# Patient Record
Sex: Female | Born: 1955 | Race: White | Hispanic: No | Marital: Married | Smoking: Never smoker
Health system: Southern US, Community
[De-identification: ages and names within clinical notes are randomized; demographics above are authoritative.]

## PROBLEM LIST (undated history)

## (undated) DIAGNOSIS — E039 Hypothyroidism, unspecified: Secondary | ICD-10-CM

## (undated) DIAGNOSIS — K635 Polyp of colon: Secondary | ICD-10-CM

## (undated) DIAGNOSIS — R011 Cardiac murmur, unspecified: Secondary | ICD-10-CM

## (undated) DIAGNOSIS — F489 Nonpsychotic mental disorder, unspecified: Secondary | ICD-10-CM

## (undated) DIAGNOSIS — F5105 Insomnia due to other mental disorder: Secondary | ICD-10-CM

## (undated) DIAGNOSIS — C801 Malignant (primary) neoplasm, unspecified: Secondary | ICD-10-CM

## (undated) DIAGNOSIS — E010 Iodine-deficiency related diffuse (endemic) goiter: Secondary | ICD-10-CM

## (undated) DIAGNOSIS — F329 Major depressive disorder, single episode, unspecified: Secondary | ICD-10-CM

## (undated) DIAGNOSIS — F419 Anxiety disorder, unspecified: Secondary | ICD-10-CM

## (undated) DIAGNOSIS — F32A Depression, unspecified: Secondary | ICD-10-CM

## (undated) DIAGNOSIS — I10 Essential (primary) hypertension: Secondary | ICD-10-CM

## (undated) DIAGNOSIS — E079 Disorder of thyroid, unspecified: Secondary | ICD-10-CM

## (undated) HISTORY — DX: Malignant (primary) neoplasm, unspecified: C80.1

## (undated) HISTORY — PX: MOHS SURGERY: SUR867

## (undated) HISTORY — DX: Iodine-deficiency related diffuse (endemic) goiter: E01.0

## (undated) HISTORY — DX: Disorder of thyroid, unspecified: E07.9

## (undated) HISTORY — DX: Cardiac murmur, unspecified: R01.1

## (undated) HISTORY — DX: Insomnia due to other mental disorder: F48.9

## (undated) HISTORY — DX: Nonpsychotic mental disorder, unspecified: F51.05

## (undated) HISTORY — DX: Polyp of colon: K63.5

## (undated) HISTORY — DX: Anxiety disorder, unspecified: F41.9

## (undated) HISTORY — DX: Essential (primary) hypertension: I10

## (undated) HISTORY — DX: Depression, unspecified: F32.A

## (undated) HISTORY — DX: Major depressive disorder, single episode, unspecified: F32.9

## (undated) HISTORY — DX: Hypothyroidism, unspecified: E03.9

## (undated) HISTORY — PX: TONSILLECTOMY: SUR1361

---

## 1985-06-11 DIAGNOSIS — Z85828 Personal history of other malignant neoplasm of skin: Secondary | ICD-10-CM

## 1985-06-11 HISTORY — DX: Personal history of other malignant neoplasm of skin: Z85.828

## 2005-12-26 ENCOUNTER — Ambulatory Visit: Payer: Self-pay

## 2006-06-11 HISTORY — PX: APPENDECTOMY: SHX54

## 2006-10-18 ENCOUNTER — Observation Stay: Payer: Self-pay | Admitting: General Surgery

## 2007-02-04 ENCOUNTER — Ambulatory Visit: Payer: Self-pay | Admitting: Internal Medicine

## 2007-05-01 ENCOUNTER — Ambulatory Visit: Payer: Self-pay | Admitting: Unknown Physician Specialty

## 2008-04-15 ENCOUNTER — Ambulatory Visit: Payer: Self-pay | Admitting: Internal Medicine

## 2010-05-18 ENCOUNTER — Ambulatory Visit: Payer: Self-pay

## 2010-12-01 ENCOUNTER — Ambulatory Visit (HOSPITAL_BASED_OUTPATIENT_CLINIC_OR_DEPARTMENT_OTHER)
Admission: RE | Admit: 2010-12-01 | Discharge: 2010-12-01 | Disposition: A | Discharge status: Home / Self Care | Payer: PRIVATE HEALTH INSURANCE | Source: Ambulatory Visit | Attending: Gynecology | Admitting: Gynecology

## 2010-12-01 ENCOUNTER — Other Ambulatory Visit: Payer: Self-pay | Admitting: Gynecology

## 2010-12-01 DIAGNOSIS — Q5128 Other doubling of uterus, other specified: Secondary | ICD-10-CM | POA: Insufficient documentation

## 2010-12-01 DIAGNOSIS — N840 Polyp of corpus uteri: Secondary | ICD-10-CM | POA: Insufficient documentation

## 2010-12-01 DIAGNOSIS — I10 Essential (primary) hypertension: Secondary | ICD-10-CM | POA: Insufficient documentation

## 2011-01-18 NOTE — Op Note (Signed)
  NAMEABBI, Teresa Petty NO.:  [id number]  MEDICAL RECORD NO.:  [id number]  LOCATION:                                 FACILITY:  PHYSICIAN:  Gretta Cool, M.D. DATE OF BIRTH:  07-Feb-1956  DATE OF PROCEDURE:  12/01/2010 DATE OF DISCHARGE:                              OPERATIVE REPORT   PREOPERATIVE DIAGNOSES: 1. 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 2. Previous resection of the left uterine horn and endometrial polyps     in 1996 with no recurrence there.  POSTOPERATIVE DIAGNOSES: 1. 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 2. Previous resection of the left uterine horn and endometrial polyps     in 1996 with no recurrence there.  PROCEDURE:  Hysteroscopy with resection of endometrial polyps from the right uterine horn.  SURGEON:  Gretta Cool, M.D.  ANESTHESIA:  IV sedation and paracervical block.  DESCRIPTION OF PROCEDURE:  Under excellent anesthesia as above with the patient prepped and draped in lithotomy position in [place] stirrups, a 7- mm resectoscope was introduced after adequate dilation of the cervix with Pratt dilators to 33.  The cavity was then examined.  It was exceedingly difficult to keep the cavity distended and see around the cavity because of its very small size.  The polyps were identified and resected as possible.  The visibility was difficult enough that it was considered a risk to continue for complete resection of the polyps, but adequate sampling was felt to have been obtained to rule out endometrial hyperplasia or more.  At this point, the procedure was terminated without complication with significant fluid deficit of approximately 400 cc.  Complications are none.          ______________________________ Gretta Cool, M.D.     CWL/MEDQ  D:  12/01/2010  T:  12/01/2010  Job:  [id number]  cc:   Dr. Welton Flakes  Electronically Signed by  Beather Arbour M.D. on 01/18/2011 09:11:12 AM

## 2011-07-04 ENCOUNTER — Ambulatory Visit: Payer: Self-pay | Admitting: Internal Medicine

## 2012-08-09 HISTORY — PX: COLONOSCOPY: SHX174

## 2012-08-12 ENCOUNTER — Ambulatory Visit: Payer: Self-pay | Admitting: Unknown Physician Specialty

## 2012-10-03 ENCOUNTER — Ambulatory Visit: Payer: PRIVATE HEALTH INSURANCE | Admitting: Cardiovascular Disease

## 2012-10-30 ENCOUNTER — Encounter: Payer: Self-pay | Admitting: Cardiovascular Disease

## 2012-10-30 ENCOUNTER — Ambulatory Visit (INDEPENDENT_AMBULATORY_CARE_PROVIDER_SITE_OTHER): Payer: PRIVATE HEALTH INSURANCE | Admitting: Cardiovascular Disease

## 2012-10-30 VITALS — BP 120/78 | HR 59 | Ht 66.0 in | Wt 158.8 lb

## 2012-10-30 DIAGNOSIS — I10 Essential (primary) hypertension: Secondary | ICD-10-CM

## 2012-10-30 DIAGNOSIS — M79609 Pain in unspecified limb: Secondary | ICD-10-CM

## 2012-10-30 DIAGNOSIS — M79602 Pain in left arm: Secondary | ICD-10-CM

## 2012-10-30 NOTE — Assessment & Plan Note (Signed)
Talked about her risk factors. She is a nonsmoker, no diabetes, no significant family history . EKG is normal, no known coronary artery disease. She is otherwise active, unable to reproduce any symptoms. We discussed that we could do a routine treadmill study. Other option would be to closely observe her and do additional studies for any recurrent symptoms. She prefers to wait, stay active, and call our office for any symptoms. If she does have any left arm pain, chest pain or shortness of breath, we would have her do a stress test. No changes to her medications at this time.

## 2012-10-30 NOTE — Patient Instructions (Addendum)
You are doing well. No medication changes were made.  Please call us if you have new issues that need to be addressed before your next appt.    

## 2012-10-30 NOTE — Addendum Note (Signed)
Addended by: Antonieta Iba on: 10/30/2012 12:41 PM   Modules accepted: Level of Service

## 2012-10-30 NOTE — Progress Notes (Signed)
Patient ID: Teresa Petty, female    DOB: 19-Sep-1955, 57 y.o.   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 Patient of Dr. Sullivan Lone. Notes from Dr. Sullivan Lone indicate some history of anxiety/depression  She reports that back in March of this year, 2014, she was at work in a very stressful meeting when she developed left shoulder pain radiating down her left arm to a small extent. She estimated the pain at more than 5/10. Lasted 10-15 minutes, some nausea. She had a second episode one week later with similar circumstances. She describes the pain as a deep pain in the muscle.  Since then she has been very active. She spends most of her time working in the garden. She push [place], and does very strenuous activities. She has not been able to reproduce any chest or arm pain since that time. She is a nonsmoker, no diabetes. She reports cholesterol is well controlled.  EKG shows sinus rhythm with rate 59 beats per minute, no significant ST or T wave changes       Outpatient Encounter Prescriptions as of 10/30/2012  Medication Sig Dispense Refill  . amLODipine-benazepril (LOTREL) 10-40 MG per capsule Take 1 capsule by mouth daily.      Marland Kitchen buPROPion (WELLBUTRIN XL) 300 MG 24 hr tablet Take 300 mg by mouth daily.      . Cholecalciferol (VITAMIN D) 2000 UNITS tablet Take 2,000 Units by mouth daily.      . folic acid (FOLVITE) 800 MCG tablet Take 400 mcg by mouth daily.      Marland Kitchen levothyroxine (SYNTHROID, LEVOTHROID) 125 MCG tablet Take 1 tablet daily except on Sunday take 1 & 1/2 tablet.      . nebivolol (BYSTOLIC) 10 MG tablet Take 10 mg by mouth daily.      . temazepam (RESTORIL) 30 MG capsule Take 30 mg by mouth at bedtime as needed for sleep.       No facility-administered  encounter medications on file as of 10/30/2012.     Review of Systems  Constitutional: Negative.   HENT: Negative.   Eyes: Negative.   Respiratory: Negative.   Cardiovascular: Negative.   Gastrointestinal: Negative.   Musculoskeletal: Negative.        Left shoulder pain/arm pain episodes in March 2014  Skin: Negative.   Neurological: Negative.   Psychiatric/Behavioral: Negative.   All other systems reviewed and are negative.    BP 120/78  Pulse 59  Ht 5\' 6"  (1.676 m)  Wt 158 lb 12 oz (72.009 kg)  BMI 25.64 kg/m2  Physical Exam  Nursing note and vitals reviewed. Constitutional: She is oriented to person, place, and time. She appears well-developed and well-nourished.  HENT:  Head: Normocephalic.  Nose: Nose normal.  Mouth/Throat: Oropharynx is clear and moist.  Eyes: Conjunctivae are normal. Pupils are equal, round, and reactive to light.  Neck: Normal range of motion. Neck supple. No JVD present.  Cardiovascular: Normal rate, regular rhythm, S1 normal, S2 normal, normal heart sounds and intact distal pulses.  Exam reveals no gallop and no friction rub.   No murmur heard. Pulmonary/Chest: Effort normal and breath sounds normal. No respiratory distress. She has no wheezes. She has no rales. She exhibits no tenderness.  Abdominal: Soft. Bowel sounds are normal. She exhibits no distension. There  is no tenderness.  Musculoskeletal: Normal range of motion. She exhibits no edema and no tenderness.  Lymphadenopathy:    She has no cervical adenopathy.  Neurological: She is alert and oriented to person, place, and time. Coordination normal.  Skin: Skin is warm and dry. No rash noted. No erythema.  Psychiatric: She has a normal mood and affect. Her behavior is normal. Judgment and thought content normal.    Assessment and Plan

## 2012-10-30 NOTE — Assessment & Plan Note (Signed)
She does have significant stress which she attributes to work. Higher blood pressure at work, very low on the weekend. She's cutting back on her pills on the weekend to avoid hypotension and orthostasis. She will call us if she would like a lower dose of amlodipine/benazepril. Recent weight loss may be contributing to lower blood pressures.

## 2013-04-10 ENCOUNTER — Ambulatory Visit: Payer: Self-pay | Admitting: Podiatry

## 2013-04-16 ENCOUNTER — Other Ambulatory Visit: Payer: Self-pay

## 2013-05-06 ENCOUNTER — Ambulatory Visit: Payer: Self-pay | Admitting: Internal Medicine

## 2013-05-19 ENCOUNTER — Ambulatory Visit: Payer: Self-pay | Admitting: Gynecology

## 2013-05-29 ENCOUNTER — Ambulatory Visit: Payer: Self-pay | Admitting: Internal Medicine

## 2013-06-23 ENCOUNTER — Other Ambulatory Visit: Payer: Self-pay | Admitting: Unknown Physician Specialty

## 2013-06-23 LAB — CLOSTRIDIUM DIFFICILE(ARMC)

## 2013-06-26 ENCOUNTER — Encounter: Payer: Self-pay | Admitting: *Deleted

## 2013-07-16 ENCOUNTER — Ambulatory Visit (INDEPENDENT_AMBULATORY_CARE_PROVIDER_SITE_OTHER): Payer: No Typology Code available for payment source | Admitting: General Surgery

## 2013-07-16 ENCOUNTER — Encounter: Payer: Self-pay | Admitting: General Surgery

## 2013-07-16 VITALS — BP 148/74 | HR 76 | Resp 14 | Ht 66.5 in | Wt 153.0 lb

## 2013-07-16 DIAGNOSIS — G8929 Other chronic pain: Secondary | ICD-10-CM | POA: Insufficient documentation

## 2013-07-16 DIAGNOSIS — R1031 Right lower quadrant pain: Secondary | ICD-10-CM

## 2013-07-16 DIAGNOSIS — R109 Unspecified abdominal pain: Secondary | ICD-10-CM

## 2013-07-16 NOTE — Patient Instructions (Addendum)
The patient is aware to call back for any questions or concerns.  

## 2013-07-16 NOTE — Progress Notes (Signed)
Patient ID: SAUL DORSI, female   DOB: 04-02-1956, 58 y.o.   MRN: [id number]  Chief Complaint  Patient presents with  . Abdominal Pain    HPI Teresa Petty is a 58 y.o. female here today for evaluation of abdominal pain referred by Dawson Bills NP. Patient had an ultrasound and CT scan done 05/2013. States since November she is having lower right abdominal pain that comes and goes. States it seems to be dull hard ache that radiated through to right side.  The waves of nausea were at first but none since. Guaiac negative stool November 2014 [facility]. Constipation since November, bowels move every 3-4 days, but only with stimulants. She has tried using stool softners and senna. No real success with miralax. Weight loss of 20 pounds over 3 months ( last spring/April), states she had a burst of energy. Appetite has not changed.  The patient reports her weight has been stable since July/August 2014. Sitting and pulling knees upward tends to cause pain in the right lower quadrant of the abdomen.  HPI  Past Medical History  Diagnosis Date  . Anxiety   . Depressive disorder   . Thyroid disease   . Hypertension   . 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 Hypothyroidism   . Thyromegaly   . Heart murmur   . Cancer     basel cell  . Colon polyp     Past Surgical History  Procedure Laterality Date  . Tonsillectomy    . Colonoscopy  March 2014    Dr Vira Agar  . Appendectomy  2008    Dr Bary Castilla    Family History  Problem Relation Age of Onset  . Hypertension Mother   . Hyperlipidemia Mother   . Hypertension Father   . Heart disease Father   . Cancer Sister 77    appendix  . Melanoma Sister 21    Social History History  Substance Use Topics  . Smoking status: Never Smoker   . Smokeless tobacco: Never Used  . Alcohol Use: No    Allergies  Allergen Reactions  . Effexor [Venlafaxine Hcl]     Scalp felt numb and crawling    Current Outpatient Prescriptions   Medication Sig Dispense Refill  . amLODipine-benazepril (LOTREL) 10-40 MG per capsule Take 1 capsule by mouth daily.      Marland Kitchen buPROPion (WELLBUTRIN XL) 300 MG 24 hr tablet Take 300 mg by mouth daily.      . Cholecalciferol (VITAMIN D) 2000 UNITS tablet Take 2,000 Units by mouth daily.      . folic acid (FOLVITE) 382 MCG tablet Take 400 mcg by mouth daily.      Marland Kitchen levothyroxine (SYNTHROID, LEVOTHROID) 125 MCG tablet Take 125 mcg by mouth daily before breakfast.       . nebivolol (BYSTOLIC) 10 MG tablet Take 10 mg by mouth daily.       No current facility-administered medications for this visit.    Review of Systems Review of Systems  Constitutional: Negative.   Respiratory: Negative.   Cardiovascular: Negative.   Gastrointestinal: Positive for nausea, abdominal pain and constipation. Negative for vomiting, diarrhea, blood in stool, abdominal distention, anal bleeding and rectal pain.    Blood pressure 148/74, pulse 76, resp. rate 14, height 5' 6.5" (1.689 m), weight 153 lb (69.4 kg).  Physical Exam Physical Exam  Constitutional: She is oriented to person, place, and time. She appears well-developed and well-nourished.  Eyes: No scleral icterus.  Neck:  Neck supple.  Cardiovascular: Normal rate, regular rhythm and normal heart sounds.   No lower leg edema.  Pulmonary/Chest: Effort normal and breath sounds normal.  Abdominal: Soft. Normal appearance and bowel sounds are normal. There is tenderness in the right lower quadrant.  Lymphadenopathy:    She has no cervical adenopathy.  Neurological: She is alert and oriented to person, place, and time.  Skin: Skin is warm and dry.    Data Reviewed CT scan of the abdomen pelvis dated May 29, 2013 was reviewed. No evidence of acute inflammatory process noted. Bicornuate uterus.  Abdominal and ultrasound dated May 06, 2013 showed normal gallbladder. No pathologic process noted.  KUB dated June 22, 2013 was unremarkable.  GI  notes from June 22, 2013 were reviewed.  Colonoscopy report from August 12, 2012 showed a 3 mm polyp in the splenic flexure which was not retrieved. No other pathology except for a few small internal hemorrhoids.  Laboratory studies dated June 23, 2013 showed a normal CBC with a hemoglobin of 12.2, MCV 90. Normal differential. On testing blood sugar 114. Normal renal function. Normal liver function studies. Normal sedimentation rate.  Pathology from the appendectomy completed Oct 18, 2006 showed acute suppurative appendicitis.  The patient's sister,Deborah Johnson's records were available for review after the patient had left. Teresa Petty was explored with the suspicion for a ovarian cancer. 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 She subsequently underwent removal of both ovaries, omentum, sigmoid colon and rectum as well as the appendix. 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 No defect in the in a mismatch repair gene was reported on special testing. Operative notes were reviewed from both the attending surgeon and gynecologist.  The patient's sister,  Assessment    Unexplained right lower quadrant pain.     Plan    The patient's unexpected weight loss over a year ago has not progressed, and she reports her weight and appetite are stable. The only notable changes were chronic right lower quadrant pain which is not been able to relate to diet or activity. New onset constipation has not improved with multiple medical modalities.  She is had a well thought out and executed diagnostic plan with no etiology for her pain identified. With no progressive weight loss, it is unlikely that she is experiencing an intra-abdominal malignancy. In light of her sister's recent exploration with finding of extensive intraperitoneal tumor  metastases, I'm sure that this weighs heavily on her mind. Options for management at this time are limited to: 1) continued observation versus 2) diagnostic laparoscopy. Risk and benefits of Laparoscopy discussed in detail. The patient will consider her options notify the office of how she would like to proceed.       Robert Bellow 07/16/2013, 9:21 PM

## 2013-07-24 ENCOUNTER — Encounter: Payer: Self-pay | Admitting: General Surgery

## 2013-07-26 ENCOUNTER — Encounter: Payer: Self-pay | Admitting: General Surgery

## 2013-09-10 ENCOUNTER — Ambulatory Visit: Payer: Self-pay | Admitting: General Practice

## 2013-09-17 ENCOUNTER — Ambulatory Visit: Payer: Self-pay | Admitting: Unknown Physician Specialty

## 2013-11-26 ENCOUNTER — Ambulatory Visit (INDEPENDENT_AMBULATORY_CARE_PROVIDER_SITE_OTHER): Payer: No Typology Code available for payment source | Admitting: Podiatry

## 2013-11-26 ENCOUNTER — Ambulatory Visit (INDEPENDENT_AMBULATORY_CARE_PROVIDER_SITE_OTHER): Payer: No Typology Code available for payment source

## 2013-11-26 ENCOUNTER — Encounter: Payer: Self-pay | Admitting: Podiatry

## 2013-11-26 VITALS — Ht 66.5 in | Wt 157.0 lb

## 2013-11-26 DIAGNOSIS — M722 Plantar fascial fibromatosis: Secondary | ICD-10-CM

## 2013-11-26 MED ORDER — MELOXICAM 15 MG PO TABS: 15.0000 mg | ORAL_TABLET | Freq: Every day | ORAL | Status: DC

## 2013-11-26 MED ORDER — METHYLPREDNISOLONE (PAK) 4 MG PO TABS: ORAL_TABLET | ORAL | Status: DC

## 2013-11-26 NOTE — Patient Instructions (Addendum)
Plantar Fasciitis (Heel Spur Syndrome) with Rehab The plantar fascia is a fibrous, ligament-like, soft-tissue structure that spans the bottom of the foot. Plantar fasciitis is a condition that causes pain in the foot due to inflammation of the tissue. SYMPTOMS   Pain and tenderness on the underneath side of the foot.  Pain that worsens with standing or walking. CAUSES  Plantar fasciitis is caused by irritation and injury to the plantar fascia on the underneath side of the foot. Common mechanisms of injury include:  Direct trauma to bottom of the foot.  Damage to a small nerve that runs under the foot where the main fascia attaches to the heel bone.  Stress placed on the plantar fascia due to bone spurs. RISK INCREASES WITH:   Activities that place stress on the plantar fascia (running, jumping, pivoting, or cutting).  Poor strength and flexibility.  Improperly fitted shoes.  Tight calf muscles.  Flat feet.  Failure to warm-up properly before activity.  Obesity. PREVENTION  Warm up and stretch properly before activity.  Allow for adequate recovery between workouts.  Maintain physical fitness:  Strength, flexibility, and endurance.  Cardiovascular fitness.  Maintain a health body weight.  Avoid stress on the plantar fascia.  Wear properly fitted shoes, including arch supports for individuals who have flat feet. PROGNOSIS  If treated properly, then the symptoms of plantar fasciitis usually resolve without surgery. However, occasionally surgery is necessary. RELATED COMPLICATIONS   Recurrent symptoms that may result in a chronic condition.  Problems of the lower back that are caused by compensating for the injury, such as limping.  Pain or weakness of the foot during push-off following surgery.  Chronic inflammation, scarring, and partial or complete fascia tear, occurring more often from repeated injections. TREATMENT  Treatment initially involves the use of  ice and medication to help reduce pain and inflammation. The use of strengthening and stretching exercises may help reduce pain with activity, especially stretches of the Achilles tendon. These exercises may be performed at home or with a therapist. Your caregiver may recommend that you use heel cups of arch supports to help reduce stress on the plantar fascia. Occasionally, corticosteroid injections are given to reduce inflammation. If symptoms persist for greater than 6 months despite non-surgical (conservative), then surgery may be recommended.  MEDICATION   If pain medication is necessary, then nonsteroidal anti-inflammatory medications, such as aspirin and ibuprofen, or other minor pain relievers, such as acetaminophen, are often recommended.  Do not take pain medication within 7 days before surgery.  Prescription pain relievers may be given if deemed necessary by your caregiver. Use only as directed and only as much as you need.  Corticosteroid injections may be given by your caregiver. These injections should be reserved for the most serious cases, because they may only be given a certain number of times. HEAT AND COLD  Cold treatment (icing) relieves pain and reduces inflammation. Cold treatment should be applied for 10 to 15 minutes every 2 to 3 hours for inflammation and pain and immediately after any activity that aggravates your symptoms. Use ice packs or massage the area with a piece of ice (ice massage).  Heat treatment may be used prior to performing the stretching and strengthening activities prescribed by your caregiver, physical therapist, or athletic trainer. Use a heat pack or soak the injury in warm water. SEEK IMMEDIATE MEDICAL CARE IF:  Treatment seems to offer no benefit, or the condition worsens.  Any medications produce adverse side effects. EXERCISES RANGE   OF MOTION (ROM) AND STRETCHING EXERCISES - Plantar Fasciitis (Heel Spur Syndrome) These exercises may help you  when beginning to rehabilitate your injury. Your symptoms may resolve with or without further involvement from your physician, physical therapist or athletic trainer. While completing these exercises, remember:   Restoring tissue flexibility helps normal motion to return to the joints. This allows healthier, less painful movement and activity.  An effective stretch should be held for at least 30 seconds.  A stretch should never be painful. You should only feel a gentle lengthening or release in the stretched tissue. RANGE OF MOTION - Toe Extension, Flexion  Sit with your right / left leg crossed over your opposite knee.  Grasp your toes and gently pull them back toward the top of your foot. You should feel a stretch on the bottom of your toes and/or foot.  Hold this stretch for __________ seconds.  Now, gently pull your toes toward the bottom of your foot. You should feel a stretch on the top of your toes and or foot.  Hold this stretch for __________ seconds. Repeat __________ times. Complete this stretch __________ times per day.  RANGE OF MOTION - Ankle Dorsiflexion, Active Assisted  Remove shoes and sit on a chair that is preferably not on a carpeted surface.  Place right / left foot under knee. Extend your opposite leg for support.  Keeping your heel down, slide your right / left foot back toward the chair until you feel a stretch at your ankle or calf. If you do not feel a stretch, slide your bottom forward to the edge of the chair, while still keeping your heel down.  Hold this stretch for __________ seconds. Repeat __________ times. Complete this stretch __________ times per day.  STRETCH - Gastroc, Standing  Place hands on wall.  Extend right / left leg, keeping the front knee somewhat bent.  Slightly point your toes inward on your back foot.  Keeping your right / left heel on the floor and your knee straight, shift your weight toward the wall, not allowing your back to  arch.  You should feel a gentle stretch in the right / left calf. Hold this position for __________ seconds. Repeat __________ times. Complete this stretch __________ times per day. STRETCH - Soleus, Standing  Place hands on wall.  Extend right / left leg, keeping the other knee somewhat bent.  Slightly point your toes inward on your back foot.  Keep your right / left heel on the floor, bend your back knee, and slightly shift your weight over the back leg so that you feel a gentle stretch deep in your back calf.  Hold this position for __________ seconds. Repeat __________ times. Complete this stretch __________ times per day. STRETCH - Gastrocsoleus, Standing  Note: This exercise can place a lot of stress on your foot and ankle. Please complete this exercise only if specifically instructed by your caregiver.   Place the ball of your right / left foot on a step, keeping your other foot firmly on the same step.  Hold on to the wall or a rail for balance.  Slowly lift your other foot, allowing your body weight to press your heel down over the edge of the step.  You should feel a stretch in your right / left calf.  Hold this position for __________ seconds.  Repeat this exercise with a slight bend in your right / left knee. Repeat __________ times. Complete this stretch __________ times per day.    STRENGTHENING EXERCISES - Plantar Fasciitis (Heel Spur Syndrome)  These exercises may help you when beginning to rehabilitate your injury. They may resolve your symptoms with or without further involvement from your physician, physical therapist or athletic trainer. While completing these exercises, remember:   Muscles can gain both the endurance and the strength needed for everyday activities through controlled exercises.  Complete these exercises as instructed by your physician, physical therapist or athletic trainer. Progress the resistance and repetitions only as guided. STRENGTH -  Towel Curls  Sit in a chair positioned on a non-carpeted surface.  Place your foot on a towel, keeping your heel on the floor.  Pull the towel toward your heel by only curling your toes. Keep your heel on the floor.  If instructed by your physician, physical therapist or athletic trainer, add ____________________ at the end of the towel. Repeat __________ times. Complete this exercise __________ times per day. STRENGTH - Ankle Inversion  Secure one end of a rubber exercise band/tubing to a fixed object (table, pole). Loop the other end around your foot just before your toes.  Place your fists between your knees. This will focus your strengthening at your ankle.  Slowly, pull your big toe up and in, making sure the band/tubing is positioned to resist the entire motion.  Hold this position for __________ seconds.  Have your muscles resist the band/tubing as it slowly pulls your foot back to the starting position. Repeat __________ times. Complete this exercises __________ times per day.  Document Released: 05/28/2005 Document Revised: 08/20/2011 Document Reviewed: 09/09/2008 [facility] Patient Information 2015 [place], [place]. This information is not intended to replace advice given to you by your health care provider. Make sure you discuss any questions you have with your health care provider.   Document Released: 03/14/2006 Document Revised: 08/20/2011 [facility] Patient Information 2015 [place]. This information is not intended to replace advice given to you by your health care provider. Make sure you discuss any questions you have with your health care provider. Plantar Fasciitis Plantar fasciitis is a common condition that causes foot pain. It is soreness (inflammation) of the band of tough fibrous tissue on the bottom of the foot that runs from the heel bone (calcaneus) to the ball of the foot. The cause of this soreness may be from excessive standing, poor fitting shoes, running  on hard surfaces, being overweight, having an abnormal walk, or overuse (this is common in runners) of the painful foot or feet. It is also common in aerobic exercise dancers and ballet dancers. SYMPTOMS  Most people with plantar fasciitis complain of:  Severe pain in the morning on the bottom of their foot especially when taking the first steps out of bed. This pain recedes after a few minutes of walking.  Severe pain is experienced also during walking following a long period of inactivity.  Pain is worse when walking barefoot or up stairs DIAGNOSIS   Your caregiver will diagnose this condition by examining and feeling your foot.  Special tests such as X-rays of your foot, are usually not needed. PREVENTION   Consult a sports medicine professional before beginning a new exercise program.  Walking programs offer a good workout. With walking there is a lower chance of overuse injuries common to runners. There is less impact and less jarring of the joints.  Begin all new exercise programs slowly. If problems or pain develop, decrease the amount of time or distance until you are at a comfortable level.  Wear good  shoes and replace them regularly.  Stretch your foot and the heel cords at the back of the ankle (Achilles tendon) both before and after exercise.  Run or exercise on even surfaces that are not hard. For example, asphalt is better than pavement.  Do not run barefoot on hard surfaces.  If using a treadmill, vary the incline.  Do not continue to workout if you have foot or joint problems. Seek professional help if they do not improve. HOME CARE INSTRUCTIONS   Avoid activities that cause you pain until you recover.  Use ice or cold packs on the problem or painful areas after working out.  Only take over-the-counter or prescription medicines for pain, discomfort, or fever as directed by your caregiver.  Soft shoe inserts or athletic shoes with air or gel sole cushions may  be helpful.  If problems continue or become more severe, consult a sports medicine caregiver or your own health care provider. Cortisone is a potent anti-inflammatory medication that may be injected into the painful area. You can discuss this treatment with your caregiver. MAKE SURE YOU:   Understand these instructions.  Will watch your condition.  Will get help right away if you are not doing well or get worse. Document Released: 02/20/2001 Document Revised: 08/20/2011 Document Reviewed: 04/21/2008 [facility] Patient Information 2015 [place], [place]. This information is not intended to replace advice given to you by your health care provider. Make sure you discuss any questions you have with your health care provi

## 2013-11-26 NOTE — Progress Notes (Signed)
   Subjective:    Patient ID: Teresa Petty, female    DOB: 02-17-1956, 58 y.o.   MRN: [id number]  HPI Comments: Saw dr cline for this foot, it feels like i have a severe bruise on my heel, he told me i have a bone spur. It has been going on since last summer . Got new shoes , several pairs, i have orthotics, i have used ibuprofen go to bed with ice on my feet. i ordered a night splint off of [employer].  No injections. Dr cline did nothing   Foot Pain      Review of Systems  All other systems reviewed and are negative.      Objective:   Physical Exam I have reviewed her past medical history medications allergies surgeries social history and review of systems. Pulses are strongly palpable bilateral. Neurologic sensorium is intact per since once the monofilament. Deep tendon reflexes are intact bilateral. Muscle strength is 5 over 5 dorsiflexors plantar flexors inverters everters all intrinsic musculature is intact orthopedic evaluation demonstrates all joints distal to the ankle a full range of motion without crepitation. She has severe pain on palpation medial continued tubercle of the left heel. Radiographic evaluation does demonstrate a plantar distally oriented calcaneal heel spur with soft tissue increase in density allow the spur. This is indicative of plantar fasciitis. Retrocalcaneal heel spurs also noted.        Assessment & Plan:  Assessment: Plantar fasciitis left heel spur syndrome left.  Plan: Discussed etiology pathology conservative versus surgical therapies. Injected the left heel today with Kenalog and local anesthetic. This Sterapred Dosepak to be followed by [employer]. She has a night splint at home which she will utilize. We discussed shoe gear modification stretching exercises shoe gear changes and ice therapy. She was dispensed a plantar fascial strapping.

## 2013-12-04 DIAGNOSIS — M722 Plantar fascial fibromatosis: Secondary | ICD-10-CM

## 2013-12-24 ENCOUNTER — Ambulatory Visit (INDEPENDENT_AMBULATORY_CARE_PROVIDER_SITE_OTHER): Payer: No Typology Code available for payment source | Admitting: Podiatry

## 2013-12-24 ENCOUNTER — Encounter: Payer: Self-pay | Admitting: Podiatry

## 2013-12-24 DIAGNOSIS — M722 Plantar fascial fibromatosis: Secondary | ICD-10-CM

## 2013-12-24 NOTE — Progress Notes (Signed)
She presents today for followup of her plantar fasciitis to her left heel. She relates that she is approximately 80-90% improved.  Objective: Vital signs are stable she is alert and oriented x3. She has minimal pain on palpation medial continued tubercle of the left heel.  Assessment: Plantar fasciitis left heel.  Plan: Reinjected her left heel today with Kenalog and local anesthetic she will continue all other conservative therapies and followup with me as needed.

## 2014-01-18 ENCOUNTER — Telehealth: Payer: Self-pay | Admitting: *Deleted

## 2014-01-18 NOTE — Telephone Encounter (Signed)
I have an appointment for Wednesday.  Last time I was there, Dr. Milinda Pointer said if my feet are not hurting I don't need to come in and you guys would tell me about wearing different shoes, brace or changing what I'm doing.  Give me a call back.

## 2014-01-19 ENCOUNTER — Telehealth: Payer: Self-pay | Admitting: *Deleted

## 2014-01-19 NOTE — Telephone Encounter (Signed)
Spoke with pt letting her know to cont with all therapy for another month, no flip flops, no bare feet, no heels to be worn for next month or so. Pt understood.

## 2014-01-19 NOTE — Telephone Encounter (Signed)
Call her back and inform her that she should continue all therapy that we have started at least for another month.  Clark and dansko should be fine.   No bare feet flip flops or heels for the next month or so.

## 2014-01-19 NOTE — Telephone Encounter (Signed)
PT CALLED SAID YOU TOLD HER IF HER HEEL PAIN WAS DOING BETTER THAT SHE COULD CANCEL HER APPT FOR TOMORROW. PT DID CANCEL. SAID SHE WAS INSTRUCTED BY YOU TO CALL OUR OFFICE AND ASK HOW TO TAKE CARE OF HER FOOT IF SHE WAS DOING BETTER. PT WANTS TO KNOW WHAT DRESS/CASUAL SHOES YOU RECOMMEND HER TO WEAR. STATES SHE HAS DANSKO AND CLARKS. DO YOU SUGGEST ANY OTHER BRAND NAME? PT WANTS TO BE CALLED BACK AT [phone]!!!

## 2014-01-20 ENCOUNTER — Ambulatory Visit: Payer: No Typology Code available for payment source | Admitting: Podiatry

## 2014-03-26 ENCOUNTER — Other Ambulatory Visit: Payer: Self-pay

## 2014-04-05 ENCOUNTER — Ambulatory Visit (INDEPENDENT_AMBULATORY_CARE_PROVIDER_SITE_OTHER): Payer: No Typology Code available for payment source | Admitting: Podiatry

## 2014-04-05 VITALS — BP 144/89 | HR 66 | Resp 16

## 2014-04-05 DIAGNOSIS — M722 Plantar fascial fibromatosis: Secondary | ICD-10-CM

## 2014-04-05 NOTE — Progress Notes (Signed)
She presents today for follow-up of her plantar fasciitis to her left foot. She states that she no longer wants to have to wear a splint at nighttime, ice the foot in the evening, take medications and wear appropriate shoe gear. She will soon of there is anything else that she can do. She specifically inquires about platelet rich plasma injections.  Objective: Vital signs are stable she is alert and oriented 3. She has pain on palpation medial calcaneal tubercle of the left heel. This is moderate to severe in nature. Pulses are palpable left. No calf pain.  Assessment: Plantar fasciitis Rule out neuritis left foot.  Plan: Discussed etiology pathology conservative versus surgical therapies. I explained to her that platelet rich plasma was not used in our office because of the cost and the lack of material demonstrating better results then corticosteroids. We did discuss the possible need for formal physical therapy which she declined. We did scan her today for a pair of orthotics. We discussed the possible need for multiple injections of dehydrated alcohol or even surgical correction.

## 2014-04-07 ENCOUNTER — Ambulatory Visit: Payer: No Typology Code available for payment source | Admitting: Podiatry

## 2014-04-12 ENCOUNTER — Encounter: Payer: Self-pay | Admitting: Podiatry

## 2014-05-11 ENCOUNTER — Other Ambulatory Visit: Payer: Self-pay | Admitting: *Deleted

## 2014-05-11 MED ORDER — MELOXICAM 15 MG PO TABS: 15.0000 mg | ORAL_TABLET | Freq: Every day | ORAL | Status: DC

## 2014-05-11 NOTE — Telephone Encounter (Signed)
[facility]ic[facility] sent refill request for meloxicam 15 mg #30 per dr Milinda Pointer refill with 3 refills.

## 2014-05-19 ENCOUNTER — Ambulatory Visit (INDEPENDENT_AMBULATORY_CARE_PROVIDER_SITE_OTHER): Payer: No Typology Code available for payment source | Admitting: Podiatry

## 2014-05-19 VITALS — BP 144/89 | HR 66 | Resp 16

## 2014-05-19 DIAGNOSIS — M722 Plantar fascial fibromatosis: Secondary | ICD-10-CM

## 2014-05-19 NOTE — Patient Instructions (Signed)

## 2014-05-19 NOTE — Progress Notes (Signed)
She presents today to pick up her orthotics. Both oral and written home going instructions were given. She tried been in her dance Shoes and her foot would not fit in them. I discussed with her today in great detail the Babinskis are cut too short and that they will not fit into them. She states that she does not want to wear tennis shoes and time should rather wear these orthotics in her dance goes I explained to her that she probably have to purchase a different pair of dance goes and has a taller upper. I will follow-up with her and possibly 6 weeks just to reevaluate. She will call sooner if needed

## 2014-06-30 ENCOUNTER — Ambulatory Visit: Payer: No Typology Code available for payment source | Admitting: Podiatry

## 2014-09-08 ENCOUNTER — Ambulatory Visit
Admit: 2014-09-08 | Disposition: A | Discharge status: Home / Self Care | Payer: Self-pay | Admitting: Nurse Practitioner

## 2014-10-28 ENCOUNTER — Telehealth: Payer: Self-pay

## 2014-10-28 NOTE — Telephone Encounter (Signed)
Pt called asking about orthotic from a year ago , called pt no answer l/m

## 2014-11-05 ENCOUNTER — Other Ambulatory Visit: Payer: Self-pay | Admitting: *Deleted

## 2014-11-05 DIAGNOSIS — M722 Plantar fascial fibromatosis: Secondary | ICD-10-CM

## 2015-05-23 ENCOUNTER — Other Ambulatory Visit: Payer: Self-pay

## 2015-05-23 DIAGNOSIS — E559 Vitamin D deficiency, unspecified: Secondary | ICD-10-CM

## 2015-05-23 DIAGNOSIS — I10 Essential (primary) hypertension: Secondary | ICD-10-CM

## 2015-05-23 DIAGNOSIS — E039 Hypothyroidism, unspecified: Secondary | ICD-10-CM

## 2015-05-23 DIAGNOSIS — Z0001 Encounter for general adult medical examination with abnormal findings: Secondary | ICD-10-CM

## 2015-05-23 NOTE — Progress Notes (Signed)
Patient came in to have blood drawn per her physician, Leretha Pol, PA-C at [facility].  Blood was drawn from the left arm without any incident.  Patient was results sent to Leretha Pol and a copy emailed to her personal email address.

## 2015-05-24 LAB — CMP12+LP+TP+TSH+6AC+CBC/D/PLT
A/G RATIO: 1.6 (ref 1.1–2.5)
ALBUMIN: 4.2 g/dL (ref 3.5–5.5)
ALK PHOS: 56 IU/L (ref 39–117)
ALT: 21 IU/L (ref 0–32)
AST: 24 IU/L (ref 0–40)
BILIRUBIN TOTAL: 0.6 mg/dL (ref 0.0–1.2)
BUN/Creatinine Ratio: 9 (ref 9–23)
BUN: 8 mg/dL (ref 6–24)
Basophils Absolute: 0 10*3/uL (ref 0.0–0.2)
Basos: 0 %
CHOLESTEROL TOTAL: 184 mg/dL (ref 100–199)
Calcium: 9.4 mg/dL (ref 8.7–10.2)
Chloride: 101 mmol/L (ref 96–106)
Chol/HDL Ratio: 2.4 ratio units (ref 0.0–4.4)
Creatinine, Ser: 0.88 mg/dL (ref 0.57–1.00)
EOS (ABSOLUTE): 0.1 10*3/uL (ref 0.0–0.4)
Eos: 3 %
Free Thyroxine Index: 2.5 (ref 1.2–4.9)
GFR calc Af Amer: 83 mL/min/{1.73_m2} (ref >59)
GFR calc non Af Amer: 72 mL/min/{1.73_m2} (ref >59)
GGT: 21 IU/L (ref 0–60)
Globulin, Total: 2.7 g/dL (ref 1.5–4.5)
Glucose: 103 mg/dL — ABNORMAL HIGH (ref 65–99)
HDL: 78 mg/dL (ref >39)
HEMATOCRIT: 37.2 % (ref 34.0–46.6)
HEMOGLOBIN: 12.6 g/dL (ref 11.1–15.9)
IMMATURE GRANULOCYTES: 0 %
IRON: 97 ug/dL (ref 27–159)
Immature Grans (Abs): 0 10*3/uL (ref 0.0–0.1)
LDH: 149 IU/L (ref 119–226)
LDL CALC: 94 mg/dL (ref 0–99)
LYMPHS ABS: 1.3 10*3/uL (ref 0.7–3.1)
LYMPHS: 33 %
MCH: 31.1 pg (ref 26.6–33.0)
MCHC: 33.9 g/dL (ref 31.5–35.7)
MCV: 92 fL (ref 79–97)
MONOS ABS: 0.2 10*3/uL (ref 0.1–0.9)
Monocytes: 6 %
Neutrophils Absolute: 2.3 10*3/uL (ref 1.4–7.0)
Neutrophils: 58 %
Phosphorus: 3.6 mg/dL (ref 2.5–4.5)
Platelets: 262 10*3/uL (ref 150–379)
Potassium: 4.7 mmol/L (ref 3.5–5.2)
RBC: 4.05 x10E6/uL (ref 3.77–5.28)
RDW: 12.8 % (ref 12.3–15.4)
Sodium: 140 mmol/L (ref 134–144)
T3 UPTAKE RATIO: 27 % (ref 24–39)
T4, Total: 9.3 ug/dL (ref 4.5–12.0)
TRIGLYCERIDES: 60 mg/dL (ref 0–149)
TSH: 2.67 u[IU]/mL (ref 0.450–4.500)
Total Protein: 6.9 g/dL (ref 6.0–8.5)
Uric Acid: 3.7 mg/dL (ref 2.5–7.1)
VLDL CHOLESTEROL CAL: 12 mg/dL (ref 5–40)
WBC: 3.9 10*3/uL (ref 3.4–10.8)

## 2015-05-24 LAB — T4, FREE: Free T4: 1.47 ng/dL (ref 0.82–1.77)

## 2015-05-24 LAB — VITAMIN D 25 HYDROXY (VIT D DEFICIENCY, FRACTURES): VIT D 25 HYDROXY: 40.6 ng/mL (ref 30.0–100.0)

## 2015-05-26 NOTE — Progress Notes (Signed)
Results were sent to [facility] and a copy was securely emailed to patient per her request.

## 2015-06-28 ENCOUNTER — Ambulatory Visit
Admission: RE | Admit: 2015-06-28 | Discharge: 2015-06-28 | Disposition: A | Discharge status: Home / Self Care | Payer: Managed Care, Other (non HMO) | Source: Ambulatory Visit | Attending: Nurse Practitioner | Admitting: Nurse Practitioner

## 2015-06-28 ENCOUNTER — Other Ambulatory Visit: Payer: Self-pay | Admitting: Nurse Practitioner

## 2015-06-28 DIAGNOSIS — M545 Low back pain: Secondary | ICD-10-CM

## 2015-06-28 DIAGNOSIS — M5136 Other intervertebral disc degeneration, lumbar region: Secondary | ICD-10-CM | POA: Diagnosis not present

## 2015-06-28 DIAGNOSIS — M549 Dorsalgia, unspecified: Secondary | ICD-10-CM | POA: Diagnosis present

## 2015-10-27 ENCOUNTER — Other Ambulatory Visit: Payer: Self-pay | Admitting: Gynecology

## 2015-10-27 DIAGNOSIS — Z1231 Encounter for screening mammogram for malignant neoplasm of breast: Secondary | ICD-10-CM

## 2015-12-02 ENCOUNTER — Ambulatory Visit: Payer: PRIVATE HEALTH INSURANCE

## 2016-02-09 ENCOUNTER — Ambulatory Visit: Payer: Self-pay | Admitting: Physician Assistant

## 2016-02-09 ENCOUNTER — Ambulatory Visit
Admission: RE | Admit: 2016-02-09 | Discharge: 2016-02-09 | Disposition: A | Discharge status: Home / Self Care | Payer: Managed Care, Other (non HMO) | Source: Ambulatory Visit | Attending: Family | Admitting: Family

## 2016-02-09 ENCOUNTER — Encounter: Payer: Self-pay | Admitting: Physician Assistant

## 2016-02-09 VITALS — BP 105/70 | HR 75 | Temp 98.3°F

## 2016-02-09 DIAGNOSIS — M25511 Pain in right shoulder: Secondary | ICD-10-CM

## 2016-02-09 MED ORDER — MELOXICAM 15 MG PO TABS: 15.0000 mg | ORAL_TABLET | Freq: Every day | ORAL | 0 refills | Status: DC

## 2016-02-09 MED ORDER — TRAMADOL HCL 50 MG PO TABS: 50.0000 mg | ORAL_TABLET | Freq: Three times a day (TID) | ORAL | 0 refills | Status: DC | PRN

## 2016-02-09 NOTE — Progress Notes (Signed)
S/ pain, tenderness and loss of mobility of R shoulder, cant sleep on R side due to pain, must position with pillows, No known trauma at home/work, very active doing mod heavy yard work and throws ball to retriever a lot with R arm;takin motrin 800 mg tid , using heat without relief  O/ VSS NAD   C spine full ROM, nontender; R trap muscle tender and tight  R SHOULDER  Diffuse tendeness , guarded and limited ROM , concerning for impingement Strength and reflexes normal A/ R shoulder pain P/xray , rx mobic, ultram .topical ice /heat.  Refer to ortho.

## 2016-02-14 NOTE — Progress Notes (Signed)
Patient was referred to see Dr. Mack Guise at [facility] on 02/20/16 @ 10:30.  Patient has been notified and has accepted the appointment.

## 2016-05-07 DIAGNOSIS — C4491 Basal cell carcinoma of skin, unspecified: Secondary | ICD-10-CM

## 2016-05-07 HISTORY — DX: Basal cell carcinoma of skin, unspecified: C44.91

## 2016-05-24 ENCOUNTER — Ambulatory Visit: Payer: Self-pay | Admitting: Physician Assistant

## 2016-05-24 ENCOUNTER — Encounter: Payer: Self-pay | Admitting: Physician Assistant

## 2016-05-24 VITALS — BP 115/80 | HR 72 | Temp 98.4°F

## 2016-05-24 DIAGNOSIS — R002 Palpitations: Secondary | ICD-10-CM

## 2016-05-24 NOTE — Addendum Note (Signed)
Addended by: Versie Starks on: 05/24/2016 03:23 PM   Modules accepted: Orders

## 2016-05-24 NOTE — Progress Notes (Signed)
S: c/o palpitations for about 2-3 weeks, states they come and go, will get a full feeling in her neck that feels like its coming from the inside, has checked her pulse and it keeps the same rate but not the same rhythm, doesn't feel like her pulse races, no cp/sob at time she notices sx, no swelling in feet legs, is on thyroid medication, hx of htn also  O: vitals wnl, nad, lungs c t a, cv rrr by auscultation, no mrg, no bruits noted, thyroid appears to be normal in size, ekg shows premature supraventricular contractions  A: palpitations  P: f/u with cardiology, appt made for tomorrow, if symptomatic overnight then go to the ER

## 2016-05-25 DIAGNOSIS — R002 Palpitations: Secondary | ICD-10-CM | POA: Insufficient documentation

## 2016-05-25 DIAGNOSIS — R079 Chest pain, unspecified: Secondary | ICD-10-CM | POA: Insufficient documentation

## 2016-05-25 LAB — THYROID PANEL WITH TSH
Free Thyroxine Index: 3 (ref 1.2–4.9)
T3 Uptake Ratio: 32 % (ref 24–39)
T4 TOTAL: 9.5 ug/dL (ref 4.5–12.0)
TSH: 4.32 u[IU]/mL (ref 0.450–4.500)

## 2016-06-01 DIAGNOSIS — I493 Ventricular premature depolarization: Secondary | ICD-10-CM | POA: Insufficient documentation

## 2016-08-10 ENCOUNTER — Other Ambulatory Visit: Payer: Self-pay

## 2016-08-10 DIAGNOSIS — Z299 Encounter for prophylactic measures, unspecified: Secondary | ICD-10-CM

## 2016-08-10 NOTE — Progress Notes (Signed)
Patient came in to have blood drawn for testing per Leretha Pol, NP at [facility] orders.

## 2016-08-11 LAB — CMP12+LP+TP+TSH+6AC+CBC/D/PLT
ALK PHOS: 43 IU/L (ref 39–117)
ALT: 16 IU/L (ref 0–32)
AST: 18 IU/L (ref 0–40)
Albumin/Globulin Ratio: 1.8 (ref 1.2–2.2)
Albumin: 4.2 g/dL (ref 3.6–4.8)
BASOS: 1 %
BILIRUBIN TOTAL: 0.7 mg/dL (ref 0.0–1.2)
BUN / CREAT RATIO: 11 — AB (ref 12–28)
BUN: 11 mg/dL (ref 8–27)
Basophils Absolute: 0 10*3/uL (ref 0.0–0.2)
CALCIUM: 9.3 mg/dL (ref 8.7–10.3)
CHLORIDE: 101 mmol/L (ref 96–106)
CREATININE: 1 mg/dL (ref 0.57–1.00)
Chol/HDL Ratio: 2.3 ratio units (ref 0.0–4.4)
Cholesterol, Total: 183 mg/dL (ref 100–199)
EOS (ABSOLUTE): 0.1 10*3/uL (ref 0.0–0.4)
EOS: 3 %
Free Thyroxine Index: 3.2 (ref 1.2–4.9)
GFR calc Af Amer: 71 mL/min/{1.73_m2} (ref >59)
GFR, EST NON AFRICAN AMERICAN: 61 mL/min/{1.73_m2} (ref >59)
GGT: 14 IU/L (ref 0–60)
GLUCOSE: 88 mg/dL (ref 65–99)
Globulin, Total: 2.3 g/dL (ref 1.5–4.5)
HDL: 80 mg/dL (ref >39)
HEMATOCRIT: 36.6 % (ref 34.0–46.6)
Hemoglobin: 12.5 g/dL (ref 11.1–15.9)
IRON: 136 ug/dL (ref 27–159)
Immature Grans (Abs): 0 10*3/uL (ref 0.0–0.1)
Immature Granulocytes: 0 %
LDH: 155 IU/L (ref 119–226)
LDL CALC: 94 mg/dL (ref 0–99)
LYMPHS ABS: 1.3 10*3/uL (ref 0.7–3.1)
Lymphs: 30 %
MCH: 31.3 pg (ref 26.6–33.0)
MCHC: 34.2 g/dL (ref 31.5–35.7)
MCV: 92 fL (ref 79–97)
Monocytes Absolute: 0.4 10*3/uL (ref 0.1–0.9)
Monocytes: 8 %
Neutrophils Absolute: 2.6 10*3/uL (ref 1.4–7.0)
Neutrophils: 58 %
Phosphorus: 3.8 mg/dL (ref 2.5–4.5)
Platelets: 254 10*3/uL (ref 150–379)
Potassium: 4.3 mmol/L (ref 3.5–5.2)
RBC: 4 x10E6/uL (ref 3.77–5.28)
RDW: 13.3 % (ref 12.3–15.4)
SODIUM: 141 mmol/L (ref 134–144)
T3 Uptake Ratio: 35 % (ref 24–39)
T4, Total: 9.2 ug/dL (ref 4.5–12.0)
TOTAL PROTEIN: 6.5 g/dL (ref 6.0–8.5)
TSH: 6.53 u[IU]/mL — ABNORMAL HIGH (ref 0.450–4.500)
Triglycerides: 46 mg/dL (ref 0–149)
URIC ACID: 4.2 mg/dL (ref 2.5–7.1)
VLDL CHOLESTEROL CAL: 9 mg/dL (ref 5–40)
WBC: 4.4 10*3/uL (ref 3.4–10.8)

## 2016-08-11 LAB — T4, FREE: FREE T4: 1.61 ng/dL (ref 0.82–1.77)

## 2016-08-11 LAB — VITAMIN D 25 HYDROXY (VIT D DEFICIENCY, FRACTURES): Vit D, 25-Hydroxy: 66.5 ng/mL (ref 30.0–100.0)

## 2016-11-14 ENCOUNTER — Ambulatory Visit: Payer: Self-pay | Admitting: Physician Assistant

## 2016-11-14 ENCOUNTER — Encounter: Payer: Self-pay | Admitting: Physician Assistant

## 2016-11-14 VITALS — BP 140/70 | HR 63 | Temp 98.4°F

## 2016-11-14 DIAGNOSIS — N611 Abscess of the breast and nipple: Secondary | ICD-10-CM

## 2016-11-14 MED ORDER — CEPHALEXIN 500 MG PO CAPS: 500.0000 mg | ORAL_CAPSULE | Freq: Three times a day (TID) | ORAL | 0 refills | Status: DC

## 2016-11-14 NOTE — Progress Notes (Signed)
Referred patient to see Dr. Bary Castilla at [facility] on 11/15/2016 at 10:30. I have contacted the patient and she has accepted the appointment.

## 2016-11-14 NOTE — Progress Notes (Signed)
S: pt states she noticed that her nipple and areola felt hard and tender 2-3 days ago, then the area got larger, now is red and swollen, nipple is inverted where it was very everted previously, ?if mastitis, no fever/chills, had normal mammogram in December, no drainage from nipple at this time  O: vitals wnl nad, skin on left breast is warm tender, area is hard and swollen, large red area surrounding areola and nipple, nipple is inverted, no orange peel appearance, no discharge or drainage, n/v intact  A: abnormal breast lesion, ?abscess vs inflammatory breast  P: trial of keflex 500mg  tid, will refer to Dr Terri Piedra , pt requesting him when I told her she needed to see a surgeon for eval

## 2016-11-15 ENCOUNTER — Encounter: Payer: Self-pay | Admitting: General Surgery

## 2016-11-15 ENCOUNTER — Ambulatory Visit (INDEPENDENT_AMBULATORY_CARE_PROVIDER_SITE_OTHER): Payer: Managed Care, Other (non HMO) | Admitting: General Surgery

## 2016-11-15 ENCOUNTER — Inpatient Hospital Stay: Payer: Self-pay

## 2016-11-15 VITALS — BP 124/72 | HR 72 | Resp 12 | Ht 66.0 in | Wt 163.0 lb

## 2016-11-15 DIAGNOSIS — N6452 Nipple discharge: Secondary | ICD-10-CM

## 2016-11-15 DIAGNOSIS — N61 Mastitis without abscess: Secondary | ICD-10-CM | POA: Diagnosis not present

## 2016-11-15 NOTE — Progress Notes (Signed)
Patient ID: Teresa Petty, female   DOB: 1955/11/16, 61 y.o.   MRN: [id number]  Chief Complaint  Patient presents with  . Other    HPI Teresa Petty is a 61 y.o. female here today for a evaluation of left nipple abscess. Patient states she noticed this area three days ago. The area is red and swollen.She noticed some yellow drainage on Monday night. The drainage stop Wednesday.  Last mammogram was done in December 2017 at her OBGYN in [place].  There is no history of trauma or nipple/oral contact. No previous episodes. Husband, Teresa Petty is present at visit.  HPI   Past Medical History:  Diagnosis Date  . Anxiety   . 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 Colon polyp   . Depressive disorder   . Heart murmur   . Hypertension   . Hypothyroidism   . 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 Thyroid disease   . Thyromegaly     Past Surgical History:  Procedure Laterality Date  . APPENDECTOMY  2008   Dr Bary Castilla  . COLONOSCOPY  March 2014   Dr Vira Agar  . TONSILLECTOMY      Family History  Problem Relation Age of Onset  . Hypertension Mother   . Hyperlipidemia Mother   . Hypertension Father   . Heart disease Father   . Cancer Sister 38       appendix  . Melanoma Sister 46    Social History Social History  Substance Use Topics  . Smoking status: Never Smoker  . Smokeless tobacco: Never Used  . Alcohol use No    Allergies  Allergen Reactions  . Effexor [Venlafaxine Hcl]     Scalp felt numb and crawling    Current Outpatient Prescriptions  Medication Sig Dispense Refill  . amLODipine-benazepril (LOTREL) 10-40 MG per capsule Take 1 capsule by mouth daily.    Marland Kitchen buPROPion (WELLBUTRIN XL) 300 MG 24 hr tablet Take 300 mg by mouth daily.    . cephALEXin (KEFLEX) 500 MG capsule Take 1 capsule (500 mg total) by mouth 3 (three) times daily. 21 capsule 0  . Cholecalciferol (VITAMIN D) 2000 UNITS tablet Take 2,000 Units by mouth daily.    . folic acid (FOLVITE) 557  MCG tablet Take 400 mcg by mouth daily.    Marland Kitchen levothyroxine (SYNTHROID, LEVOTHROID) 125 MCG tablet Take 125 mcg by mouth daily before breakfast.     . Levothyroxine Sodium 137 MCG CAPS Take by mouth.    . metoprolol succinate (TOPROL-XL) 25 MG 24 hr tablet Take by mouth.     No current facility-administered medications for this visit.     Review of Systems Review of Systems  Constitutional: Negative.   Respiratory: Negative.   Cardiovascular: Negative.     Blood pressure 124/72, pulse 72, resp. rate 12, height 5\' 6"  (1.676 m), weight 163 lb (73.9 kg).  Physical Exam Physical Exam  Constitutional: She is oriented to person, place, and time. She appears well-developed and well-nourished.  Eyes: Conjunctivae are normal. No scleral icterus.  Neck: Neck supple.  Cardiovascular: Normal rate, regular rhythm and normal heart sounds.   Pulmonary/Chest: Effort normal and breath sounds normal. Right breast exhibits no inverted nipple, no mass, no nipple discharge, no skin change and no tenderness. Left breast exhibits tenderness. Left breast exhibits no inverted nipple, no mass, no nipple discharge and no skin change.    Left breast 7 by 9 area of redness,3 by 4 fullness  above the nipple  No nipple drainage evident. Minimal compression applied secondary to reported tenderness.  Lymphadenopathy:    She has no cervical adenopathy.    She has no axillary adenopathy.  Neurological: She is alert and oriented to person, place, and time.  Skin: Skin is warm and dry.    Data Reviewed Mammograms from December 2017 will be provided by the patient at her next visit.  Ultrasound examination of the left breast was undertaken to determine if an abscess amenable to aspiration was evident. In the area just above the nipple at 12:00 position an ill-defined hypoechoic mass measuring approximately 1.78 x 2.3 x 3 cm is identified. The appearance is not typically that noted with an abscess. Edema of the  surrounding skin is noted consistent with the clinical impression of cellulitis. BI-RADS-4.    Assessment    New onset breast cellulitis, possible abscess versus tumor.    Plan    The patient is too sensitive to consider a office biopsy at this time. She reports marked improvement over the last 24 hours with the initiation of oral Keflex therapy. Common things being common, this is likely a early abscess developing. The patient will make use of heat over the weekend and continue her Keflex, 500 mg 3 times a day as previously prescribed. He'll have a reexam in 4 days and likely completed biopsy at that time.     Patient to return on Monday for a left breast biopsy.   HPI, Physical Exam, Assessment and Plan have been scribed under the direction and in the presence of Hervey Ard, MD.  Gaspar Cola, CMA  I have completed the exam and reviewed the above documentation for accuracy and completeness.  I agree with the above.  Haematologist has been used and any errors in dictation or transcription are unintentional.  Hervey Ard, M.D., F.A.C.S.   Robert Bellow 11/16/2016, 7:44 PM

## 2016-11-15 NOTE — Patient Instructions (Addendum)
Patient to return on Monday for a left breast biopsy. The patient is aware to use a heating pad as needed for comfort.

## 2016-11-16 DIAGNOSIS — N61 Mastitis without abscess: Secondary | ICD-10-CM | POA: Insufficient documentation

## 2016-11-19 ENCOUNTER — Encounter: Payer: Self-pay | Admitting: General Surgery

## 2016-11-19 ENCOUNTER — Inpatient Hospital Stay: Payer: Self-pay

## 2016-11-19 ENCOUNTER — Ambulatory Visit (INDEPENDENT_AMBULATORY_CARE_PROVIDER_SITE_OTHER): Payer: Managed Care, Other (non HMO) | Admitting: General Surgery

## 2016-11-19 VITALS — BP 134/74 | HR 58 | Resp 12 | Ht 66.0 in | Wt 156.0 lb

## 2016-11-19 DIAGNOSIS — N6320 Unspecified lump in the left breast, unspecified quadrant: Secondary | ICD-10-CM

## 2016-11-19 HISTORY — PX: BREAST BIOPSY: SHX20

## 2016-11-19 NOTE — Progress Notes (Signed)
Patient ID: Teresa Petty, female   DOB: 1955/09/14, 61 y.o.   MRN: [id number]  Chief Complaint  Patient presents with  . Procedure    HPI Teresa Petty is a 61 y.o. female here today for a left breast biopsy. The patient has continued to improve with her oral Keflex therapy. No GI intolerance. Marked decrease in breast discomfort. Accompanied today by her husband. HPI  Past Medical History:  Diagnosis Date  . Anxiety   . 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 Colon polyp   . Depressive disorder   . Heart murmur   . Hypertension   . Hypothyroidism   . 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 Thyroid disease   . Thyromegaly     Past Surgical History:  Procedure Laterality Date  . APPENDECTOMY  2008   Dr Bary Castilla  . COLONOSCOPY  March 2014   Dr Vira Agar  . TONSILLECTOMY      Family History  Problem Relation Age of Onset  . Hypertension Mother   . Hyperlipidemia Mother   . Hypertension Father   . Heart disease Father   . Cancer Sister 10       appendix  . Melanoma Sister 58    Social History Social History  Substance Use Topics  . Smoking status: Never Smoker  . Smokeless tobacco: Never Used  . Alcohol use No    Allergies  Allergen Reactions  . Effexor [Venlafaxine Hcl]     Scalp felt numb and crawling    Current Outpatient Prescriptions  Medication Sig Dispense Refill  . amLODipine-benazepril (LOTREL) 10-40 MG per capsule Take 1 capsule by mouth daily.    Marland Kitchen buPROPion (WELLBUTRIN XL) 300 MG 24 hr tablet Take 300 mg by mouth daily.    . cephALEXin (KEFLEX) 500 MG capsule Take 1 capsule (500 mg total) by mouth 3 (three) times daily. 21 capsule 0  . Cholecalciferol (VITAMIN D) 2000 UNITS tablet Take 2,000 Units by mouth daily.    . folic acid (FOLVITE) 010 MCG tablet Take 400 mcg by mouth daily.    Marland Kitchen levothyroxine (SYNTHROID, LEVOTHROID) 125 MCG tablet Take 125 mcg by mouth daily before breakfast.     . Levothyroxine Sodium 137 MCG CAPS Take by mouth.    .  metoprolol succinate (TOPROL-XL) 25 MG 24 hr tablet Take by mouth.     No current facility-administered medications for this visit.     Review of Systems Review of Systems  Blood pressure 134/74, pulse (!) 58, resp. rate 12, height 5\' 6"  (1.676 m), weight 156 lb (70.8 kg).  Physical Exam Physical Exam  Pulmonary/Chest:    Lymphadenopathy:    She has no axillary adenopathy.       Left: No supraclavicular adenopathy present.    Data Reviewed Ultrasound examination showed a persistent hypoechoic mass in the 12:00 position, 1 cm from the nipple. This has an irregular contour measuring 1.1 x 1.7 x 1.71 cm. BIRAD-4.  The patient was amenable to proceed to core biopsy. Alcohol prep followed by 10 mL of buffered 0.5% Xylocaine with 0.25% Marcaine with 1-200,000 of epinephrine. ChloraPrep was applied to the skin. From a lateral approach a 14-gauge Bard biopsy device was inserted and 4 core samples obtained from a variety of locations within the mass. No purulent drainage. Mild-moderate transient discomfort which resolved after biopsy needle removal. Postbiopsy clip placed. Skin defect closed with benzoin and Steri-Strips followed by Telfa and Tegaderm dressing.  Written instructions for  wound care provided.  Assessment    Left breast mass with accompanying inflammation, hopefully inflammatory/infectious versus malignancy.    Plan    The patient will be contacted when pathology results are available.     Follow up appointment to be announced.   HPI, Physical Exam, Assessment and Plan have been scribed under the direction and in the presence of Hervey Ard, MD.  Gaspar Cola, CMA  I have completed the exam and reviewed the above documentation for accuracy and completeness.  I agree with the above.  Haematologist has been used and any errors in dictation or transcription are unintentional.  Hervey Ard, M.D., F.A.C.S.   Teresa Petty 11/19/2016, 1:10 PM

## 2016-11-19 NOTE — Patient Instructions (Signed)

## 2016-11-21 ENCOUNTER — Telehealth: Payer: Self-pay | Admitting: Emergency Medicine

## 2016-11-21 MED ORDER — FLUCONAZOLE 150 MG PO TABS: ORAL_TABLET | ORAL | 0 refills | Status: DC

## 2016-11-21 NOTE — Telephone Encounter (Signed)
Pt has yeast infection from antibiotics

## 2016-11-21 NOTE — Telephone Encounter (Signed)
Patient called and expressed that she has developed a yeast infection from the antibiotics you have written for her.  She is requesting a Diflucan be called to [place].

## 2016-11-29 ENCOUNTER — Ambulatory Visit (INDEPENDENT_AMBULATORY_CARE_PROVIDER_SITE_OTHER): Payer: Managed Care, Other (non HMO) | Admitting: General Surgery

## 2016-11-29 ENCOUNTER — Encounter: Payer: Self-pay | Admitting: General Surgery

## 2016-11-29 VITALS — BP 118/72 | HR 62 | Resp 12 | Ht 66.5 in | Wt 161.0 lb

## 2016-11-29 DIAGNOSIS — N61 Mastitis without abscess: Secondary | ICD-10-CM

## 2016-11-29 NOTE — Progress Notes (Signed)
Patient ID: Teresa Petty, female   DOB: 12-01-1955, 61 y.o.   MRN: [id number]  Chief Complaint  Patient presents with  . Follow-up    HPI Teresa Petty is a 61 y.o. female.  Here for follow up left breast abscess with biopsy done 11-19-16. She states she is doing well, much improved.  HPI  Past Medical History:  Diagnosis Date  . Anxiety   . 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 Colon polyp   . Depressive disorder   . Heart murmur   . Hypertension   . Hypothyroidism   . 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 Thyroid disease   . Thyromegaly     Past Surgical History:  Procedure Laterality Date  . APPENDECTOMY  2008   Dr Bary Castilla  . BREAST BIOPSY Left 11/19/2016   FIBROCYSTIC CHANGES WITH FEATURES OF RUPTURE AND SUBSEQUENT INFLAMMATION, CONSISTENT WITH  . COLONOSCOPY  March 2014   Dr Vira Agar  . TONSILLECTOMY      Family History  Problem Relation Age of Onset  . Hypertension Mother   . Hyperlipidemia Mother   . Hypertension Father   . Heart disease Father   . Cancer Sister 104       appendix  . Melanoma Sister 46    Social History Social History  Substance Use Topics  . Smoking status: Never Smoker  . Smokeless tobacco: Never Used  . Alcohol use No    Allergies  Allergen Reactions  . Effexor [Venlafaxine Hcl]     Scalp felt numb and crawling    Current Outpatient Prescriptions  Medication Sig Dispense Refill  . amLODipine-benazepril (LOTREL) 10-40 MG per capsule Take 1 capsule by mouth daily.    Marland Kitchen buPROPion (WELLBUTRIN XL) 300 MG 24 hr tablet Take 300 mg by mouth daily.    . Cholecalciferol (VITAMIN D) 2000 UNITS tablet Take 2,000 Units by mouth daily.    Marland Kitchen estradiol (MINIVELLE) 0.075 MG/24HR Place 0.5 patches onto the skin 2 (two) times a week.    . folic acid (FOLVITE) 443 MCG tablet Take 400 mcg by mouth daily.    Marland Kitchen levothyroxine (SYNTHROID, LEVOTHROID) 125 MCG tablet Take 125 mcg by mouth daily before breakfast.     . Levothyroxine Sodium 137 MCG  CAPS Take by mouth.    . metoprolol succinate (TOPROL-XL) 25 MG 24 hr tablet Take by mouth.     No current facility-administered medications for this visit.     Review of Systems Review of Systems  Constitutional: Negative.   Respiratory: Negative.   Cardiovascular: Negative.     Blood pressure 118/72, pulse 62, resp. rate 12, height 5' 6.5" (1.689 m), weight 161 lb (73 kg).  Physical Exam Physical Exam  Constitutional: She is oriented to person, place, and time. She appears well-developed and well-nourished.  Pulmonary/Chest: Left breast exhibits no inverted nipple, no mass, no nipple discharge, no skin change and no tenderness.    1 x 2 area residual thickening edge areolar toward biopsy site left breast. Redness completely resolved. Nontender.  Neurological: She is alert and oriented to person, place, and time.  Skin: Skin is warm and dry.  Psychiatric: Her behavior is normal.    Data Reviewed 11/19/2016 core biopsy: Breast, left, 12:00 o'clock - FIBROCYSTIC CHANGES WITH FEATURES OF RUPTURE AND SUBSEQUENT INFLAMMATION, CONSISTENT WITH ABSCESS. - THERE IS NO EVIDENCE OF MALIGNANCY.  Assessment    Excellent resolution of breast cellulitis/abscess.    Plan    Etiology  of this episode is unclear, but its complete resolution with antibiotic therapy and benign biopsy warrants no additional workup. The patient has been encouraged to call promptly should she develop any recurrent pain, redness or tenderness.    Follow up as needed, continue annual mammograms as scheduled. The patient is aware to call back for any questions or new concerns.   HPI, Physical Exam, Assessment and Plan have been scribed under the direction and in the presence of Robert Bellow, MD.  Karie Fetch, RN   Robert Bellow 11/30/2016, 6:18 AM

## 2016-11-29 NOTE — Patient Instructions (Addendum)
  Follow up as needed, continue annual mammograms as scheduled. The patient is aware to call back for any questions or new concerns.

## 2017-07-16 ENCOUNTER — Ambulatory Visit: Payer: Managed Care, Other (non HMO) | Admitting: Nurse Practitioner

## 2017-07-16 ENCOUNTER — Encounter: Payer: Self-pay | Admitting: Nurse Practitioner

## 2017-07-16 VITALS — BP 132/80 | HR 68 | Resp 16 | Ht 66.5 in | Wt 162.0 lb

## 2017-07-16 DIAGNOSIS — L209 Atopic dermatitis, unspecified: Secondary | ICD-10-CM | POA: Diagnosis not present

## 2017-07-16 DIAGNOSIS — E039 Hypothyroidism, unspecified: Secondary | ICD-10-CM | POA: Diagnosis not present

## 2017-07-16 DIAGNOSIS — F329 Major depressive disorder, single episode, unspecified: Secondary | ICD-10-CM | POA: Diagnosis not present

## 2017-07-16 DIAGNOSIS — F32A Depression, unspecified: Secondary | ICD-10-CM | POA: Insufficient documentation

## 2017-07-16 DIAGNOSIS — I10 Essential (primary) hypertension: Secondary | ICD-10-CM

## 2017-07-16 MED ORDER — DESONIDE 0.05 % EX CREA: TOPICAL_CREAM | Freq: Two times a day (BID) | CUTANEOUS | 2 refills | Status: DC

## 2017-07-16 NOTE — Progress Notes (Addendum)
[facility] [place], [place] [address]  Internal MEDICINE  Office Visit Note  Patient Name: Teresa Petty  [id number]  [id number]  Date of Service: 07/16/2017  No chief complaint on file.   Hypertension  This is a chronic problem. The current episode started more than 1 year ago. The problem is unchanged. The problem is controlled. Associated symptoms include palpitations. Pertinent negatives include no chest pain, neck pain or shortness of breath. Agents associated with hypertension include estrogens. There are no known risk factors for coronary artery disease. Past treatments include beta blockers and calcium channel blockers. The current treatment provides moderate improvement. There are no compliance problems.  Identifiable causes of hypertension include a thyroid problem.    Pt is here for routine follow up.    Current Medication: Outpatient Encounter Medications as of 07/16/2017  Medication Sig  . amLODipine-benazepril (LOTREL) 10-40 MG per capsule Take 1 capsule by mouth daily.  Marland Kitchen buPROPion (WELLBUTRIN XL) 300 MG 24 hr tablet Take 300 mg by mouth daily.  . Cholecalciferol (VITAMIN D) 2000 UNITS tablet Take 2,000 Units by mouth daily.  Marland Kitchen estradiol (MINIVELLE) 0.075 MG/24HR Place 0.5 patches onto the skin 2 (two) times a week.  . folic acid (FOLVITE) 222 MCG tablet Take 400 mcg by mouth daily.  . Levothyroxine Sodium 137 MCG CAPS Take by mouth.  . metoprolol succinate (TOPROL-XL) 25 MG 24 hr tablet Take by mouth.  . Multiple Vitamins-Minerals (MULTIVITAMIN ADULT PO) Take by mouth.  . [DISCONTINUED] levothyroxine (SYNTHROID, LEVOTHROID) 125 MCG tablet Take 125 mcg by mouth daily before breakfast.    No facility-administered encounter medications on file as of 07/16/2017.     Surgical History: Past Surgical History:  Procedure Laterality Date  . APPENDECTOMY  2008   Dr Bary Castilla  . BREAST BIOPSY Left 11/19/2016   FIBROCYSTIC CHANGES WITH FEATURES OF  RUPTURE AND SUBSEQUENT INFLAMMATION, CONSISTENT WITH  . COLONOSCOPY  March 2014   Dr Vira Agar  . TONSILLECTOMY      Medical History: Past Medical History:  Diagnosis Date  . Anxiety   . Cancer ([place])    basel cell  . Colon polyp   . Depressive disorder   . Heart murmur   . Hypertension   . Hypothyroidism   . 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 Thyroid disease   . Thyromegaly     Family History: Family History  Problem Relation Age of Onset  . Hypertension Mother   . Hyperlipidemia Mother   . Hypertension Father   . Heart disease Father   . Cancer Sister 70       appendix  . Melanoma Sister 37    Social History   Socioeconomic History  . Marital status: Married    Spouse name: Not on file  . Number of children: Not on file  . Years of education: Not on file  . Highest education level: Not on file  Social Needs  . Financial resource strain: Not on file  . Food insecurity - worry: Not on file  . Food insecurity - inability: Not on file  . Transportation needs - medical: Not on file  . Transportation needs - non-medical: Not on file  Occupational History  . Not on file  Tobacco Use  . Smoking status: Never Smoker  . Smokeless tobacco: Never Used  Substance and Sexual Activity  . Alcohol use: No  . Drug use: No  . Sexual activity: Not on file  Other Topics Concern  .  Not on file  Social History Narrative  . Not on file      Review of Systems  Constitutional: Negative for activity change and chills.  HENT: Negative for congestion, postnasal drip, rhinorrhea, sneezing and sore throat.   Eyes: Negative for redness.  Respiratory: Negative for chest tightness, shortness of breath and wheezing.   Cardiovascular: Positive for palpitations. Negative for chest pain.       Intermittent palpitations controlled through medications  Gastrointestinal: Negative for abdominal pain, constipation, diarrhea, nausea and vomiting.  Genitourinary: Negative for  dysuria and frequency.  Musculoskeletal: Negative for arthralgias, back pain, joint swelling and neck pain.  Skin: Negative for rash.  Neurological: Negative.  Negative for tremors and numbness.  Hematological: Negative for adenopathy. Does not bruise/bleed easily.  Psychiatric/Behavioral: Negative for behavioral problems (Depression), sleep disturbance and suicidal ideas. The patient is not nervous/anxious.     Today's Vitals   07/16/17 0843  BP: 132/80  Pulse: 68  Resp: 16  SpO2: 99%  Weight: 162 lb (73.5 kg)  Height: 5' 6.5" (1.689 m)    Physical Exam  Constitutional: She is oriented to person, place, and time. She appears well-developed and well-nourished. No distress.  HENT:  Head: Normocephalic and atraumatic.  Mouth/Throat: Oropharynx is clear and moist. No oropharyngeal exudate.  Eyes: EOM are normal. Pupils are equal, round, and reactive to light.  Neck: Normal range of motion. Neck supple. No JVD present. Carotid bruit is not present. No tracheal deviation present. No thyromegaly present.  Cardiovascular: Normal rate, regular rhythm and normal heart sounds. Exam reveals no gallop and no friction rub.  No murmur heard. Pulmonary/Chest: Effort normal and breath sounds normal. No respiratory distress. She has no wheezes. She has no rales. She exhibits no tenderness.  Abdominal: Soft. Bowel sounds are normal. There is no tenderness.  Musculoskeletal: Normal range of motion.  Lymphadenopathy:    She has no cervical adenopathy.  Neurological: She is alert and oriented to person, place, and time. No cranial nerve deficit.  Skin: Skin is warm and dry. She is not diaphoretic.  Psychiatric: She has a normal mood and affect. Her behavior is normal. Judgment and thought content normal.  Nursing note and vitals reviewed.   Assessment/Plan:  1. Atopic dermatitis, unspecified type - desonide (DESOWEN) 0.05 % cream; Apply topically 2 (two) times daily.  Dispense: 15 g; Refill:  2  2. Acquired hypothyroidism Check thyroid panel and adjust thyroid medication as indicated.   3. Essential hypertension Stable. Continue bp medication as prescribed   4. Depressive disorder Stable with current medications.  General Counseling: paetyn pietrzak understanding of the findings of todays visit and agrees with plan of treatment. I have discussed any further diagnostic evaluation that may be needed or ordered today. We also reviewed her medications today. she has been encouraged to call the office with any questions or concerns that should arise related to todays visit.   This patient was seen by Leretha Pol, FNP- C in Collaboration with Dr Lavera Guise as a part of collaborative care agreement    Time spent: 16 Minutes     Dr Lavera Guise Internal medicine

## 2017-08-26 ENCOUNTER — Other Ambulatory Visit: Payer: Self-pay

## 2017-08-26 MED ORDER — BUPROPION HCL ER (XL) 300 MG PO TB24: 300.0000 mg | ORAL_TABLET | Freq: Every day | ORAL | 3 refills | Status: DC

## 2017-09-02 ENCOUNTER — Other Ambulatory Visit: Payer: Self-pay | Admitting: Internal Medicine

## 2017-09-03 ENCOUNTER — Other Ambulatory Visit: Payer: Self-pay

## 2017-09-03 DIAGNOSIS — I10 Essential (primary) hypertension: Secondary | ICD-10-CM

## 2017-09-03 DIAGNOSIS — E559 Vitamin D deficiency, unspecified: Secondary | ICD-10-CM

## 2017-09-03 DIAGNOSIS — E782 Mixed hyperlipidemia: Secondary | ICD-10-CM

## 2017-09-03 DIAGNOSIS — Z0001 Encounter for general adult medical examination with abnormal findings: Secondary | ICD-10-CM

## 2017-09-03 NOTE — Addendum Note (Signed)
Addended by: Carlene Coria on: 09/03/2017 08:38 AM   Modules accepted: Level of Service

## 2017-09-04 LAB — LIPID PANEL
CHOL/HDL RATIO: 2.2 ratio (ref 0.0–4.4)
Cholesterol, Total: 177 mg/dL (ref 100–199)
HDL: 79 mg/dL (ref >39)
LDL Calculated: 87 mg/dL (ref 0–99)
TRIGLYCERIDES: 53 mg/dL (ref 0–149)
VLDL Cholesterol Cal: 11 mg/dL (ref 5–40)

## 2017-09-04 LAB — COMPREHENSIVE METABOLIC PANEL
A/G RATIO: 1.8 (ref 1.2–2.2)
ALT: 14 IU/L (ref 0–32)
AST: 19 IU/L (ref 0–40)
Albumin: 4.4 g/dL (ref 3.6–4.8)
Alkaline Phosphatase: 51 IU/L (ref 39–117)
BUN/Creatinine Ratio: 14 (ref 12–28)
BUN: 15 mg/dL (ref 8–27)
Bilirubin Total: 0.5 mg/dL (ref 0.0–1.2)
CALCIUM: 9.1 mg/dL (ref 8.7–10.3)
CO2: 22 mmol/L (ref 20–29)
CREATININE: 1.06 mg/dL — AB (ref 0.57–1.00)
Chloride: 106 mmol/L (ref 96–106)
GFR calc non Af Amer: 57 mL/min/{1.73_m2} — ABNORMAL LOW (ref >59)
GFR, EST AFRICAN AMERICAN: 65 mL/min/{1.73_m2} (ref >59)
GLOBULIN, TOTAL: 2.4 g/dL (ref 1.5–4.5)
Glucose: 94 mg/dL (ref 65–99)
POTASSIUM: 4.4 mmol/L (ref 3.5–5.2)
Sodium: 140 mmol/L (ref 134–144)
TOTAL PROTEIN: 6.8 g/dL (ref 6.0–8.5)

## 2017-09-04 LAB — CBC WITH DIFFERENTIAL/PLATELET
BASOS ABS: 0 10*3/uL (ref 0.0–0.2)
Basos: 1 %
EOS (ABSOLUTE): 0.1 10*3/uL (ref 0.0–0.4)
Eos: 2 %
Hematocrit: 36.9 % (ref 34.0–46.6)
Hemoglobin: 12.6 g/dL (ref 11.1–15.9)
Immature Grans (Abs): 0 10*3/uL (ref 0.0–0.1)
Immature Granulocytes: 0 %
LYMPHS ABS: 1.2 10*3/uL (ref 0.7–3.1)
Lymphs: 32 %
MCH: 31.2 pg (ref 26.6–33.0)
MCHC: 34.1 g/dL (ref 31.5–35.7)
MCV: 91 fL (ref 79–97)
MONOCYTES: 8 %
MONOS ABS: 0.3 10*3/uL (ref 0.1–0.9)
NEUTROS ABS: 2.2 10*3/uL (ref 1.4–7.0)
Neutrophils: 57 %
PLATELETS: 266 10*3/uL (ref 150–379)
RBC: 4.04 x10E6/uL (ref 3.77–5.28)
RDW: 13 % (ref 12.3–15.4)
WBC: 3.8 10*3/uL (ref 3.4–10.8)

## 2017-09-04 LAB — VITAMIN D 25 HYDROXY (VIT D DEFICIENCY, FRACTURES): Vit D, 25-Hydroxy: 53.5 ng/mL (ref 30.0–100.0)

## 2017-09-04 LAB — TSH: TSH: 1.56 u[IU]/mL (ref 0.450–4.500)

## 2017-09-04 LAB — T4, FREE: FREE T4: 1.58 ng/dL (ref 0.82–1.77)

## 2017-11-01 ENCOUNTER — Other Ambulatory Visit: Payer: Self-pay | Admitting: Internal Medicine

## 2017-12-27 ENCOUNTER — Ambulatory Visit: Payer: Self-pay

## 2018-01-01 ENCOUNTER — Other Ambulatory Visit: Payer: Self-pay

## 2018-01-01 MED ORDER — BUPROPION HCL ER (XL) 300 MG PO TB24: 300.0000 mg | ORAL_TABLET | Freq: Every day | ORAL | 3 refills | Status: DC

## 2018-03-05 ENCOUNTER — Other Ambulatory Visit: Payer: Self-pay | Admitting: Internal Medicine

## 2018-03-13 ENCOUNTER — Other Ambulatory Visit: Payer: Self-pay

## 2018-03-13 MED ORDER — LEVOTHYROXINE SODIUM 137 MCG PO TABS: 137.0000 ug | ORAL_TABLET | Freq: Every day | ORAL | 3 refills | Status: DC

## 2018-03-28 ENCOUNTER — Other Ambulatory Visit: Payer: Self-pay | Admitting: Nurse Practitioner

## 2018-03-28 ENCOUNTER — Telehealth: Payer: Self-pay

## 2018-03-28 DIAGNOSIS — M545 Low back pain, unspecified: Secondary | ICD-10-CM

## 2018-03-28 MED ORDER — PREDNISONE 5 MG (21) PO TBPK: ORAL_TABLET | ORAL | 0 refills | Status: DC

## 2018-03-28 MED ORDER — TIZANIDINE HCL 4 MG PO TABS: 4.0000 mg | ORAL_TABLET | Freq: Four times a day (QID) | ORAL | 0 refills | Status: DC | PRN

## 2018-03-28 NOTE — Telephone Encounter (Signed)
Patient called c/o low back pain. Added tizanidine to take as needed for muscle pain. Also added prednisone dose pack if needed. Take as directed for 6 days. Both sent to her pharmacy

## 2018-03-28 NOTE — Telephone Encounter (Signed)
Pt advised we send med if is worse need to been seen

## 2018-03-28 NOTE — Progress Notes (Signed)
Patient called c/o low back pain. Added tizanidine to take as needed for muscle pain. Also added prednisone dose pack if needed. Take as directed for 6 days. Both sent to her pharmacy.

## 2018-05-14 ENCOUNTER — Other Ambulatory Visit: Payer: Self-pay

## 2018-05-14 MED ORDER — BUPROPION HCL ER (XL) 300 MG PO TB24: 300.0000 mg | ORAL_TABLET | Freq: Every day | ORAL | 3 refills | Status: DC

## 2018-06-18 ENCOUNTER — Ambulatory Visit: Payer: Managed Care, Other (non HMO) | Admitting: Podiatry

## 2018-06-18 ENCOUNTER — Encounter

## 2018-06-18 ENCOUNTER — Encounter: Payer: Self-pay | Admitting: Podiatry

## 2018-06-18 ENCOUNTER — Ambulatory Visit (INDEPENDENT_AMBULATORY_CARE_PROVIDER_SITE_OTHER): Payer: Managed Care, Other (non HMO)

## 2018-06-18 ENCOUNTER — Other Ambulatory Visit: Payer: Self-pay | Admitting: Podiatry

## 2018-06-18 VITALS — BP 116/71 | HR 61 | Temp 97.9°F

## 2018-06-18 DIAGNOSIS — M722 Plantar fascial fibromatosis: Secondary | ICD-10-CM

## 2018-06-18 NOTE — Progress Notes (Signed)
Subjective:  Patient ID: Teresa Petty, female    DOB: Sep 10, 1955,  MRN: [id number] HPI Chief Complaint  Patient presents with  . Plantar Fasciitis    Patient presents today for plantar fasciitis flare up left heel x 2 months ago.  She reports it was very bad when not being active and she has been wearing her night splint and stretching which has helped alot.  She states "it doesn't hurt now"  She is here to get new orthotics    63 y.o. female presents with the above complaint.   ROS: Denies fever chills nausea vomiting muscle aches pains calf pain back pain chest pain shortness of breath.  Past Medical History:  Diagnosis Date  . Anxiety   . Cancer ([place])    basel cell  . Colon polyp   . Depressive disorder   . Heart murmur   . Hypertension   . Hypothyroidism   . 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 Thyroid disease   . Thyromegaly    Past Surgical History:  Procedure Laterality Date  . APPENDECTOMY  2008   Dr Bary Castilla  . BREAST BIOPSY Left 11/19/2016   FIBROCYSTIC CHANGES WITH FEATURES OF RUPTURE AND SUBSEQUENT INFLAMMATION, CONSISTENT WITH  . COLONOSCOPY  March 2014   Dr Vira Agar  . TONSILLECTOMY      Current Outpatient Medications:  .  amLODipine-benazepril (LOTREL) 10-40 MG capsule, TAKE ONE CAPSULE BY MOUTH EVERY DAY FOR BLOOD PRESSURE., Disp: 30 capsule, Rfl: 5 .  buPROPion (WELLBUTRIN XL) 300 MG 24 hr tablet, Take 1 tablet (300 mg total) by mouth daily., Disp: 30 tablet, Rfl: 3 .  Cholecalciferol (VITAMIN D) 2000 UNITS tablet, Take 2,000 Units by mouth daily., Disp: , Rfl:  .  desonide (DESOWEN) 0.05 % cream, Apply topically 2 (two) times daily., Disp: 15 g, Rfl: 2 .  estradiol (MINIVELLE) 0.075 MG/24HR, Place 0.5 patches onto the skin 2 (two) times a week., Disp: , Rfl:  .  folic acid (FOLVITE) 063 MCG tablet, Take 400 mcg by mouth daily., Disp: , Rfl:  .  levothyroxine (SYNTHROID, LEVOTHROID) 137 MCG tablet, Take 1 tablet (137 mcg total) by mouth daily.,  Disp: 30 tablet, Rfl: 3 .  metoprolol succinate (TOPROL-XL) 25 MG 24 hr tablet, Take by mouth., Disp: , Rfl:  .  Multiple Vitamins-Minerals (MULTIVITAMIN ADULT PO), Take by mouth., Disp: , Rfl:  .  tiZANidine (ZANAFLEX) 4 MG tablet, Take 1 tablet (4 mg total) by mouth every 6 (six) hours as needed for muscle spasms., Disp: 30 tablet, Rfl: 0  Allergies  Allergen Reactions  . Effexor [Venlafaxine Hcl]     Scalp felt numb and crawling   Review of Systems Objective:   Vitals:   06/18/18 0907  BP: 116/71  Pulse: 61  Temp: 97.9 F (36.6 C)    General: Well developed, nourished, in no acute distress, alert and oriented x3   Dermatological: Skin is warm, dry and supple bilateral. Nails x 10 are well maintained; remaining integument appears unremarkable at this time. There are no open sores, no preulcerative lesions, no rash or signs of infection present.  Vascular: Dorsalis Pedis artery and Posterior Tibial artery pedal pulses are 2/4 bilateral with immedate capillary fill time. Pedal hair growth present. No varicosities and no lower extremity edema present bilateral.   Neruologic: Grossly intact via light touch bilateral. Vibratory intact via tuning fork bilateral. Protective threshold with Semmes Wienstein monofilament intact to all pedal sites bilateral. Patellar and Achilles deep tendon reflexes  2+ bilateral. No Babinski or clonus noted bilateral.   Musculoskeletal: No gross boney pedal deformities bilateral. No pain, crepitus, or limitation noted with foot and ankle range of motion bilateral. Muscular strength 5/5 in all groups tested bilateral.  Gait: Unassisted, Nonantalgic.    Radiographs:  No acute findings  Assessment & Plan:   Assessment: History of plantar fasciitis resolved  Plan: She was seen by Liliane Channel for new set of orthotics.     Rashanna Christiana T. [place], [place]

## 2018-07-09 ENCOUNTER — Ambulatory Visit (INDEPENDENT_AMBULATORY_CARE_PROVIDER_SITE_OTHER): Payer: Managed Care, Other (non HMO) | Admitting: Orthotics

## 2018-07-09 DIAGNOSIS — M722 Plantar fascial fibromatosis: Secondary | ICD-10-CM

## 2018-07-09 NOTE — Progress Notes (Signed)
Patient came in today to pick up custom made foot orthotics.  The goals were accomplished and the patient reported no dissatisfaction with said orthotics.  Patient was advised of breakin period and how to report any issues. 

## 2018-07-17 ENCOUNTER — Ambulatory Visit: Payer: Self-pay | Admitting: Nurse Practitioner

## 2018-07-18 ENCOUNTER — Encounter: Payer: Self-pay | Admitting: Nurse Practitioner

## 2018-07-18 ENCOUNTER — Ambulatory Visit: Payer: Managed Care, Other (non HMO) | Admitting: Nurse Practitioner

## 2018-07-18 VITALS — BP 110/80 | HR 63 | Resp 16 | Ht 66.5 in | Wt 163.8 lb

## 2018-07-18 DIAGNOSIS — I10 Essential (primary) hypertension: Secondary | ICD-10-CM | POA: Diagnosis not present

## 2018-07-18 DIAGNOSIS — E039 Hypothyroidism, unspecified: Secondary | ICD-10-CM | POA: Diagnosis not present

## 2018-07-18 DIAGNOSIS — F329 Major depressive disorder, single episode, unspecified: Secondary | ICD-10-CM | POA: Diagnosis not present

## 2018-07-18 DIAGNOSIS — F32A Depression, unspecified: Secondary | ICD-10-CM

## 2018-07-18 NOTE — Progress Notes (Signed)
[facility] [place], [place] [address]  Internal MEDICINE  Office Visit Note  Patient Name: Teresa Petty  [id number]  [id number]  Date of Service: 07/18/2018  Chief Complaint  Patient presents with  . Hypertension  . Hypothyroidism    The patient is here for routine follow up visit. History of Hypertension. Blood pressure well controlled. Taking metoprolol and amlodipine to control. Does see cardiologist once yearly for monitoring. She has had her well woman exam and mammogram done late January, 2020. She will be due to have routine, fasting blood work at the end of march or early April. Will provide order slip for this today.       Current Medication: Outpatient Encounter Medications as of 07/18/2018  Medication Sig  . amLODipine-benazepril (LOTREL) 10-40 MG capsule TAKE ONE CAPSULE BY MOUTH EVERY DAY FOR BLOOD PRESSURE.  Marland Kitchen buPROPion (WELLBUTRIN XL) 300 MG 24 hr tablet Take 1 tablet (300 mg total) by mouth daily.  . Cholecalciferol (VITAMIN D) 2000 UNITS tablet Take 2,000 Units by mouth daily.  Marland Kitchen desonide (DESOWEN) 0.05 % cream Apply topically 2 (two) times daily.  Marland Kitchen estradiol (MINIVELLE) 0.075 MG/24HR Place 0.5 patches onto the skin 2 (two) times a week.  . folic acid (FOLVITE) 858 MCG tablet Take 400 mcg by mouth daily.  Marland Kitchen levothyroxine (SYNTHROID, LEVOTHROID) 137 MCG tablet Take 1 tablet (137 mcg total) by mouth daily.  . Multiple Vitamins-Minerals (MULTIVITAMIN ADULT PO) Take by mouth.  Marland Kitchen tiZANidine (ZANAFLEX) 4 MG tablet Take 1 tablet (4 mg total) by mouth every 6 (six) hours as needed for muscle spasms.  . metoprolol succinate (TOPROL-XL) 25 MG 24 hr tablet Take by mouth.   No facility-administered encounter medications on file as of 07/18/2018.     Surgical History: Past Surgical History:  Procedure Laterality Date  . APPENDECTOMY  2008   Dr Bary Castilla  . BREAST BIOPSY Left 11/19/2016   FIBROCYSTIC CHANGES WITH FEATURES OF RUPTURE AND  SUBSEQUENT INFLAMMATION, CONSISTENT WITH  . COLONOSCOPY  March 2014   Dr Vira Agar  . TONSILLECTOMY      Medical History: Past Medical History:  Diagnosis Date  . Anxiety   . Cancer ([place])    basel cell  . Colon polyp   . Depressive disorder   . Heart murmur   . Hypertension   . Hypothyroidism   . 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 Thyroid disease   . Thyromegaly     Family History: Family History  Problem Relation Age of Onset  . Hypertension Mother   . Hyperlipidemia Mother   . Hypertension Father   . Heart disease Father   . Cancer Sister 32       appendix  . Melanoma Sister 24    Social History   Socioeconomic History  . Marital status: Married    Spouse name: Not on file  . Number of children: Not on file  . Years of education: Not on file  . Highest education level: Not on file  Occupational History  . Not on file  Social Needs  . Financial resource strain: Not on file  . Food insecurity:    Worry: Not on file    Inability: Not on file  . Transportation needs:    Medical: Not on file    Non-medical: Not on file  Tobacco Use  . Smoking status: Never Smoker  . Smokeless tobacco: Never Used  Substance and Sexual Activity  . Alcohol use: No  .  Drug use: No  . Sexual activity: Not on file  Lifestyle  . Physical activity:    Days per week: Not on file    Minutes per session: Not on file  . Stress: Not on file  Relationships  . Social connections:    Talks on phone: Not on file    Gets together: Not on file    Attends religious service: Not on file    Active member of club or organization: Not on file    Attends meetings of clubs or organizations: Not on file    Relationship status: Not on file  . Intimate partner violence:    Fear of current or ex partner: Not on file    Emotionally abused: Not on file    Physically abused: Not on file    Forced sexual activity: Not on file  Other Topics Concern  . Not on file  Social History  Narrative  . Not on file      Review of Systems  Constitutional: Negative for activity change and chills.  HENT: Negative for congestion, postnasal drip, rhinorrhea, sneezing and sore throat.   Eyes: Negative for redness.  Respiratory: Negative for chest tightness, shortness of breath and wheezing.   Cardiovascular: Positive for palpitations. Negative for chest pain.       Intermittent palpitations controlled through medications  Gastrointestinal: Negative for abdominal pain, constipation, diarrhea, nausea and vomiting.  Endocrine: Negative for cold intolerance, heat intolerance, polydipsia and polyuria.       Well controlled hypothyroid.  Musculoskeletal: Negative for arthralgias, back pain, joint swelling and neck pain.  Skin: Negative for rash.  Allergic/Immunologic: Negative for environmental allergies.  Neurological: Negative for dizziness, tremors, numbness and headaches.  Hematological: Negative for adenopathy. Does not bruise/bleed easily.  Psychiatric/Behavioral: Negative for behavioral problems (Depression), sleep disturbance and suicidal ideas. The patient is nervous/anxious.        Well managed with current medication.   Today's Vitals   07/18/18 0937  BP: 110/80  Pulse: 63  Resp: 16  SpO2: 97%  Weight: 163 lb 12.8 oz (74.3 kg)  Height: 5' 6.5" (1.689 m)   Body mass index is 26.04 kg/m.  Physical Exam Vitals signs and nursing note reviewed.  Constitutional:      General: She is not in acute distress.    Appearance: Normal appearance. She is well-developed. She is not diaphoretic.  HENT:     Head: Normocephalic and atraumatic.     Mouth/Throat:     Pharynx: No oropharyngeal exudate.  Eyes:     Pupils: Pupils are equal, round, and reactive to light.  Neck:     Musculoskeletal: Normal range of motion and neck supple.     Thyroid: No thyromegaly.     Vascular: No carotid bruit or JVD.     Trachea: No tracheal deviation.  Cardiovascular:     Rate and  Rhythm: Normal rate and regular rhythm.     Heart sounds: Normal heart sounds. No murmur. No friction rub. No gallop.   Pulmonary:     Effort: Pulmonary effort is normal. No respiratory distress.     Breath sounds: Normal breath sounds. No wheezing or rales.  Chest:     Chest wall: No tenderness.  Abdominal:     General: Bowel sounds are normal.     Palpations: Abdomen is soft.     Tenderness: There is no abdominal tenderness.  Musculoskeletal: Normal range of motion.  Lymphadenopathy:     Cervical: No cervical adenopathy.  Skin:    General: Skin is warm and dry.  Neurological:     Mental Status: She is alert and oriented to person, place, and time.     Cranial Nerves: No cranial nerve deficit.  Psychiatric:        Behavior: Behavior normal.        Thought Content: Thought content normal.        Judgment: Judgment normal.   Assessment/Plan: 1. Acquired hypothyroidism Will check thyroid panel and adjust levothyroxine dosing as indicated.   2. Essential hypertension Well controlled. Continue bp medication and regular visits with cardiology  3. Depressive disorder Continue wellbutrin as prescribed   General Counseling: yulonda wheeling understanding of the findings of todays visit and agrees with plan of treatment. I have discussed any further diagnostic evaluation that may be needed or ordered today. We also reviewed her medications today. she has been encouraged to call the office with any questions or concerns that should arise related to todays visit.   This patient was seen by Leretha Pol FNP Collaboration with Dr Lavera Guise as a part of collaborative care agreement   Time spent: 91 Minutes   Dr Lavera Guise Internal medicine

## 2018-08-05 ENCOUNTER — Other Ambulatory Visit: Payer: Self-pay

## 2018-08-05 MED ORDER — LEVOTHYROXINE SODIUM 137 MCG PO TABS: 137.0000 ug | ORAL_TABLET | Freq: Every day | ORAL | 3 refills | Status: DC

## 2018-09-11 ENCOUNTER — Other Ambulatory Visit: Payer: Self-pay

## 2018-09-11 MED ORDER — BUPROPION HCL ER (XL) 300 MG PO TB24: 300.0000 mg | ORAL_TABLET | Freq: Every day | ORAL | 3 refills | Status: DC

## 2018-09-11 MED ORDER — AMLODIPINE BESY-BENAZEPRIL HCL 10-40 MG PO CAPS: ORAL_CAPSULE | ORAL | 5 refills | Status: DC

## 2018-10-02 ENCOUNTER — Other Ambulatory Visit: Payer: Managed Care, Other (non HMO)

## 2018-10-02 ENCOUNTER — Other Ambulatory Visit: Payer: Self-pay

## 2018-10-02 DIAGNOSIS — Z0000 Encounter for general adult medical examination without abnormal findings: Secondary | ICD-10-CM

## 2018-10-02 DIAGNOSIS — E559 Vitamin D deficiency, unspecified: Secondary | ICD-10-CM | POA: Diagnosis not present

## 2018-10-02 DIAGNOSIS — E039 Hypothyroidism, unspecified: Secondary | ICD-10-CM

## 2018-10-02 NOTE — Addendum Note (Signed)
Addended by: Judie Petit on: 10/02/2018 12:18 PM   Modules accepted: Level of Service

## 2018-10-03 LAB — LIPID PANEL WITH LDL/HDL RATIO
Cholesterol, Total: 173 mg/dL (ref 100–199)
HDL: 75 mg/dL (ref >39)
LDL Calculated: 87 mg/dL (ref 0–99)
LDl/HDL Ratio: 1.2 ratio (ref 0.0–3.2)
Triglycerides: 56 mg/dL (ref 0–149)
VLDL Cholesterol Cal: 11 mg/dL (ref 5–40)

## 2018-10-03 LAB — CBC WITH DIFFERENTIAL/PLATELET
Basophils Absolute: 0 10*3/uL (ref 0.0–0.2)
Basos: 1 %
EOS (ABSOLUTE): 0.1 10*3/uL (ref 0.0–0.4)
Eos: 2 %
Hematocrit: 35.5 % (ref 34.0–46.6)
Hemoglobin: 12.4 g/dL (ref 11.1–15.9)
Immature Grans (Abs): 0 10*3/uL (ref 0.0–0.1)
Immature Granulocytes: 0 %
Lymphocytes Absolute: 1.4 10*3/uL (ref 0.7–3.1)
Lymphs: 31 %
MCH: 31 pg (ref 26.6–33.0)
MCHC: 34.9 g/dL (ref 31.5–35.7)
MCV: 89 fL (ref 79–97)
Monocytes Absolute: 0.4 10*3/uL (ref 0.1–0.9)
Monocytes: 9 %
Neutrophils Absolute: 2.6 10*3/uL (ref 1.4–7.0)
Neutrophils: 57 %
Platelets: 267 10*3/uL (ref 150–450)
RBC: 4 x10E6/uL (ref 3.77–5.28)
RDW: 12.5 % (ref 11.7–15.4)
WBC: 4.6 10*3/uL (ref 3.4–10.8)

## 2018-10-03 LAB — COMPREHENSIVE METABOLIC PANEL
ALT: 20 IU/L (ref 0–32)
AST: 22 IU/L (ref 0–40)
Albumin/Globulin Ratio: 1.5 (ref 1.2–2.2)
Albumin: 4.1 g/dL (ref 3.8–4.8)
Alkaline Phosphatase: 59 IU/L (ref 39–117)
BUN/Creatinine Ratio: 12 (ref 12–28)
BUN: 11 mg/dL (ref 8–27)
Bilirubin Total: 0.6 mg/dL (ref 0.0–1.2)
CO2: 21 mmol/L (ref 20–29)
Calcium: 9.2 mg/dL (ref 8.7–10.3)
Chloride: 102 mmol/L (ref 96–106)
Creatinine, Ser: 0.93 mg/dL (ref 0.57–1.00)
GFR calc Af Amer: 76 mL/min/{1.73_m2} (ref >59)
GFR calc non Af Amer: 66 mL/min/{1.73_m2} (ref >59)
Globulin, Total: 2.8 g/dL (ref 1.5–4.5)
Glucose: 93 mg/dL (ref 65–99)
Potassium: 4.6 mmol/L (ref 3.5–5.2)
Sodium: 138 mmol/L (ref 134–144)
Total Protein: 6.9 g/dL (ref 6.0–8.5)

## 2018-10-03 LAB — TSH+FREE T4
Free T4: 1.63 ng/dL (ref 0.82–1.77)
TSH: 0.411 u[IU]/mL — ABNORMAL LOW (ref 0.450–4.500)

## 2018-10-03 LAB — VITAMIN D 25 HYDROXY (VIT D DEFICIENCY, FRACTURES): Vit D, 25-Hydroxy: 68.7 ng/mL (ref 30.0–100.0)

## 2018-12-03 ENCOUNTER — Other Ambulatory Visit: Payer: Self-pay

## 2018-12-03 MED ORDER — LEVOTHYROXINE SODIUM 137 MCG PO TABS: 137.0000 ug | ORAL_TABLET | Freq: Every day | ORAL | 3 refills | Status: DC

## 2019-02-05 ENCOUNTER — Other Ambulatory Visit: Payer: Self-pay

## 2019-02-05 DIAGNOSIS — L209 Atopic dermatitis, unspecified: Secondary | ICD-10-CM

## 2019-02-05 MED ORDER — DESONIDE 0.05 % EX CREA: TOPICAL_CREAM | Freq: Two times a day (BID) | CUTANEOUS | 2 refills | Status: DC

## 2019-02-09 ENCOUNTER — Other Ambulatory Visit: Payer: Self-pay

## 2019-02-09 MED ORDER — BUPROPION HCL ER (XL) 300 MG PO TB24: 300.0000 mg | ORAL_TABLET | Freq: Every day | ORAL | 3 refills | Status: DC

## 2019-04-06 ENCOUNTER — Other Ambulatory Visit: Payer: Self-pay | Admitting: Nurse Practitioner

## 2019-04-06 MED ORDER — AMLODIPINE BESY-BENAZEPRIL HCL 10-40 MG PO CAPS: ORAL_CAPSULE | ORAL | 5 refills | Status: DC

## 2019-04-06 MED ORDER — LEVOTHYROXINE SODIUM 137 MCG PO TABS: 137.0000 ug | ORAL_TABLET | Freq: Every day | ORAL | 3 refills | Status: DC

## 2019-06-03 ENCOUNTER — Other Ambulatory Visit: Payer: Self-pay

## 2019-06-03 MED ORDER — BUPROPION HCL ER (XL) 300 MG PO TB24: 300.0000 mg | ORAL_TABLET | Freq: Every day | ORAL | 2 refills | Status: DC

## 2019-07-16 ENCOUNTER — Telehealth: Payer: Self-pay

## 2019-07-16 NOTE — Telephone Encounter (Signed)
Patient called confirmed virtual visit on 07/20/2019. klh

## 2019-07-20 ENCOUNTER — Ambulatory Visit: Payer: Managed Care, Other (non HMO) | Admitting: Nurse Practitioner

## 2019-07-20 ENCOUNTER — Encounter: Payer: Self-pay | Admitting: Nurse Practitioner

## 2019-07-20 VITALS — Ht 66.5 in | Wt 162.0 lb

## 2019-07-20 DIAGNOSIS — E559 Vitamin D deficiency, unspecified: Secondary | ICD-10-CM | POA: Diagnosis not present

## 2019-07-20 DIAGNOSIS — E039 Hypothyroidism, unspecified: Secondary | ICD-10-CM | POA: Diagnosis not present

## 2019-07-20 DIAGNOSIS — I10 Essential (primary) hypertension: Secondary | ICD-10-CM

## 2019-07-20 NOTE — Progress Notes (Signed)
[facility] [place], [place] [address]  Internal MEDICINE  Telephone Visit  Patient Name: Teresa Petty  [id number]  [id number]  Date of Service: 07/20/2019  I connected with the patient at 11:00am by webcam and verified the patients identity using two identifiers.   I discussed the limitations, risks, security and privacy concerns of performing an evaluation and management service by webcam and the availability of in person appointments. I also discussed with the patient that there may be a patient responsible charge related to the service.  The patient expressed understanding and agrees to proceed.    Chief Complaint  Patient presents with  . Telephone Assessment  . Telephone Screen  . Hypothyroidism    The patient has been contacted via webcam for follow up visit due to concerns for spread of novel coronavirus. Patient states she is doing well and without concerns or complaints. She had flu shot and first shingles shot in October, 2020. She had both doses of Pfizer COVID 19 vaccine. Dates documented in her immunization history. She is scheduled to see cardiologist in March. She is scheduled for her annual well woman exam and mammogram later this month.       Current Medication: Outpatient Encounter Medications as of 07/20/2019  Medication Sig  . amLODipine-benazepril (LOTREL) 10-40 MG capsule TAKE ONE CAPSULE BY MOUTH EVERY DAY FOR BLOOD PRESSURE.  Marland Kitchen buPROPion (WELLBUTRIN XL) 300 MG 24 hr tablet Take 1 tablet (300 mg total) by mouth daily.  . Cholecalciferol (VITAMIN D) 2000 UNITS tablet Take 2,000 Units by mouth daily.  Marland Kitchen desonide (DESOWEN) 0.05 % cream Apply topically 2 (two) times daily.  Marland Kitchen estradiol (MINIVELLE) 0.075 MG/24HR Place 0.5 patches onto the skin 2 (two) times a week.  . folic acid (FOLVITE) Q000111Q MCG tablet Take 400 mcg by mouth daily.  Marland Kitchen levothyroxine (SYNTHROID) 137 MCG tablet Take 1 tablet (137 mcg total) by mouth daily.  . Multiple  Vitamins-Minerals (MULTIVITAMIN ADULT PO) Take by mouth.  . progesterone (PROMETRIUM) 200 MG capsule   . tiZANidine (ZANAFLEX) 4 MG tablet Take 1 tablet (4 mg total) by mouth every 6 (six) hours as needed for muscle spasms.  . metoprolol succinate (TOPROL-XL) 25 MG 24 hr tablet Take by mouth.  . [DISCONTINUED] pimecrolimus (ELIDEL) 1 % cream    No facility-administered encounter medications on file as of 07/20/2019.    Surgical History: Past Surgical History:  Procedure Laterality Date  . APPENDECTOMY  2008   Dr Bary Castilla  . BREAST BIOPSY Left 11/19/2016   FIBROCYSTIC CHANGES WITH FEATURES OF RUPTURE AND SUBSEQUENT INFLAMMATION, CONSISTENT WITH  . COLONOSCOPY  March 2014   Dr Vira Agar  . TONSILLECTOMY      Medical History: Past Medical History:  Diagnosis Date  . Anxiety   . Cancer ([place])    basel cell  . Colon polyp   . Depressive disorder   . Heart murmur   . Hx of basal cell carcinoma 1987   multiple sites   . Hypertension   . Hypothyroidism   . 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 Thyroid disease   . Thyromegaly     Family History: Family History  Problem Relation Age of Onset  . Hypertension Mother   . Hyperlipidemia Mother   . Hypertension Father   . Heart disease Father   . Cancer Sister 21       appendix  . Melanoma Sister 6    Social History   Socioeconomic History  .  Marital status: Married    Spouse name: Not on file  . Number of children: Not on file  . Years of education: Not on file  . Highest education level: Not on file  Occupational History  . Not on file  Tobacco Use  . Smoking status: Never Smoker  . Smokeless tobacco: Never Used  Substance and Sexual Activity  . Alcohol use: No  . Drug use: No  . Sexual activity: Not on file  Other Topics Concern  . Not on file  Social History Narrative  . Not on file   Social Determinants of Health   Financial Resource Strain:   . Difficulty of Paying Living Expenses: Not on file   Food Insecurity:   . Worried About Charity fundraiser in the Last Year: Not on file  . Ran Out of Food in the Last Year: Not on file  Transportation Needs:   . Lack of Transportation (Medical): Not on file  . Lack of Transportation (Non-Medical): Not on file  Physical Activity:   . Days of Exercise per Week: Not on file  . Minutes of Exercise per Session: Not on file  Stress:   . Feeling of Stress : Not on file  Social Connections:   . Frequency of Communication with Friends and Family: Not on file  . Frequency of Social Gatherings with Friends and Family: Not on file  . Attends Religious Services: Not on file  . Active Member of Clubs or Organizations: Not on file  . Attends Archivist Meetings: Not on file  . Marital Status: Not on file  Intimate Partner Violence:   . Fear of Current or Ex-Partner: Not on file  . Emotionally Abused: Not on file  . Physically Abused: Not on file  . Sexually Abused: Not on file      Review of Systems  Constitutional: Negative for activity change and chills.  HENT: Negative for congestion, postnasal drip, rhinorrhea, sneezing and sore throat.   Eyes: Negative for redness.  Respiratory: Negative for chest tightness, shortness of breath and wheezing.   Cardiovascular: Positive for palpitations. Negative for chest pain.       Intermittent palpitations controlled through medications. Sees cardiology.   Gastrointestinal: Negative for abdominal pain, constipation, diarrhea, nausea and vomiting.  Endocrine: Negative for cold intolerance, heat intolerance, polydipsia and polyuria.       Well controlled hypothyroid.  Musculoskeletal: Negative for arthralgias, back pain, joint swelling and neck pain.  Skin: Negative for rash.  Allergic/Immunologic: Negative for environmental allergies.  Neurological: Negative for dizziness, tremors, numbness and headaches.  Hematological: Negative for adenopathy. Does not bruise/bleed easily.   Psychiatric/Behavioral: Negative for behavioral problems (Depression), sleep disturbance and suicidal ideas. The patient is nervous/anxious.        Well managed with current medication.    Today's Vitals   07/20/19 0919  Weight: 162 lb (73.5 kg)  Height: 5' 6.5" (1.689 m)   Body mass index is 25.76 kg/m.  Observation/Objective:   The patient is alert and oriented. She is pleasant and answers all questions appropriately. Breathing is non-labored. She is in no acute distress at this time.    Assessment/Plan: 1. Essential hypertension bp stable. Continue bp medication as prescribed. Routinely follows up with cardiology.  - CBC with Differential/Platelet; Future - Comprehensive metabolic panel; Future - Lipid panel; Future  2. Acquired hypothyroidism Check thyroid panel and adjust levothyroxine dose as indicated.  - CBC with Differential/Platelet; Future - T4, free; Future - TSH;  Future  3. Vitamin D deficiency Check vitamin d level and treat as indicated.  - Vitamin D 1,25 dihydroxy; Future  General Counseling: jessamae hessel understanding of the findings of today's phone visit and agrees with plan of treatment. I have discussed any further diagnostic evaluation that may be needed or ordered today. We also reviewed her medications today. she has been encouraged to call the office with any questions or concerns that should arise related to todays visit.  This patient was seen by Leretha Pol FNP Collaboration with Dr Lavera Guise as a part of collaborative care agreement  Orders Placed This Encounter  Procedures  . CBC with Differential/Platelet  . Comprehensive metabolic panel  . T4, free  . TSH  . Lipid panel  . Vitamin D 1,25 dihydroxy      Time spent: 63 Minutes    Dr Lavera Guise Internal medicine

## 2019-08-06 ENCOUNTER — Other Ambulatory Visit: Payer: Self-pay

## 2019-08-06 MED ORDER — LEVOTHYROXINE SODIUM 137 MCG PO TABS: 137.0000 ug | ORAL_TABLET | Freq: Every day | ORAL | 3 refills | Status: DC

## 2019-09-01 ENCOUNTER — Other Ambulatory Visit: Payer: Self-pay

## 2019-09-01 MED ORDER — BUPROPION HCL ER (XL) 300 MG PO TB24: 300.0000 mg | ORAL_TABLET | Freq: Every day | ORAL | 3 refills | Status: DC

## 2019-10-02 ENCOUNTER — Other Ambulatory Visit: Payer: Self-pay

## 2019-10-02 MED ORDER — AMLODIPINE BESY-BENAZEPRIL HCL 10-40 MG PO CAPS: ORAL_CAPSULE | ORAL | 5 refills | Status: DC

## 2019-10-07 ENCOUNTER — Other Ambulatory Visit: Payer: Self-pay

## 2019-10-07 ENCOUNTER — Other Ambulatory Visit: Payer: Managed Care, Other (non HMO)

## 2019-10-07 DIAGNOSIS — E039 Hypothyroidism, unspecified: Secondary | ICD-10-CM | POA: Diagnosis not present

## 2019-10-07 DIAGNOSIS — I10 Essential (primary) hypertension: Secondary | ICD-10-CM | POA: Diagnosis not present

## 2019-10-08 LAB — CBC WITH DIFFERENTIAL/PLATELET
Basophils Absolute: 0 10*3/uL (ref 0.0–0.2)
Basos: 1 %
EOS (ABSOLUTE): 0.1 10*3/uL (ref 0.0–0.4)
Eos: 2 %
Hematocrit: 38.5 % (ref 34.0–46.6)
Hemoglobin: 13 g/dL (ref 11.1–15.9)
Immature Grans (Abs): 0 10*3/uL (ref 0.0–0.1)
Immature Granulocytes: 0 %
Lymphocytes Absolute: 1.8 10*3/uL (ref 0.7–3.1)
Lymphs: 32 %
MCH: 31.5 pg (ref 26.6–33.0)
MCHC: 33.8 g/dL (ref 31.5–35.7)
MCV: 93 fL (ref 79–97)
Monocytes Absolute: 0.4 10*3/uL (ref 0.1–0.9)
Monocytes: 8 %
Neutrophils Absolute: 3.2 10*3/uL (ref 1.4–7.0)
Neutrophils: 57 %
Platelets: 298 10*3/uL (ref 150–450)
RBC: 4.13 x10E6/uL (ref 3.77–5.28)
RDW: 12.4 % (ref 11.7–15.4)
WBC: 5.6 10*3/uL (ref 3.4–10.8)

## 2019-10-08 LAB — LIPID PANEL
Chol/HDL Ratio: 2.3 ratio (ref 0.0–4.4)
Cholesterol, Total: 191 mg/dL (ref 100–199)
HDL: 84 mg/dL (ref >39)
LDL Chol Calc (NIH): 95 mg/dL (ref 0–99)
Triglycerides: 63 mg/dL (ref 0–149)
VLDL Cholesterol Cal: 12 mg/dL (ref 5–40)

## 2019-10-08 LAB — COMPREHENSIVE METABOLIC PANEL
ALT: 27 IU/L (ref 0–32)
AST: 28 IU/L (ref 0–40)
Albumin/Globulin Ratio: 1.8 (ref 1.2–2.2)
Albumin: 4.6 g/dL (ref 3.8–4.8)
Alkaline Phosphatase: 64 IU/L (ref 39–117)
BUN/Creatinine Ratio: 15 (ref 12–28)
BUN: 16 mg/dL (ref 8–27)
Bilirubin Total: 0.8 mg/dL (ref 0.0–1.2)
CO2: 20 mmol/L (ref 20–29)
Calcium: 9.3 mg/dL (ref 8.7–10.3)
Chloride: 101 mmol/L (ref 96–106)
Creatinine, Ser: 1.09 mg/dL — ABNORMAL HIGH (ref 0.57–1.00)
GFR calc Af Amer: 62 mL/min/{1.73_m2} (ref >59)
GFR calc non Af Amer: 54 mL/min/{1.73_m2} — ABNORMAL LOW (ref >59)
Globulin, Total: 2.5 g/dL (ref 1.5–4.5)
Glucose: 98 mg/dL (ref 65–99)
Potassium: 5 mmol/L (ref 3.5–5.2)
Sodium: 137 mmol/L (ref 134–144)
Total Protein: 7.1 g/dL (ref 6.0–8.5)

## 2019-10-08 LAB — TSH+FREE T4
Free T4: 1.72 ng/dL (ref 0.82–1.77)
TSH: 2.21 u[IU]/mL (ref 0.450–4.500)

## 2019-10-08 LAB — VITAMIN D 25 HYDROXY (VIT D DEFICIENCY, FRACTURES): Vit D, 25-Hydroxy: 60.6 ng/mL (ref 30.0–100.0)

## 2019-11-17 ENCOUNTER — Telehealth: Payer: Self-pay

## 2019-11-17 NOTE — Telephone Encounter (Signed)
Found the information on this and its called 40M Window Tint to block UV rays.

## 2019-11-17 NOTE — Telephone Encounter (Signed)
Patient has called the office several times wanting you to write a prescription or some sort of letter to allow her to get illegal tinted windows due to skin cancers. The last time I had a patient ask for this I was told no by Dr. Nicole Kindred and Dr. Laurence Ferrari and there had been new legal window tint out that had some sort of UVB shielding.  What do you advise?

## 2019-11-18 NOTE — Telephone Encounter (Signed)
When was the last time she was seen? Is the 22M product legal in [place]? And [place]? Did the patient have skin cancer history?

## 2019-11-18 NOTE — Telephone Encounter (Signed)
I will be happy to consider discussing the 3M tinting with the patient after her next visit with me. With history of skin cancer, we recommend seeing yearly. She would be overdue for an appointment if not seen since 2019.

## 2019-11-18 NOTE — Telephone Encounter (Signed)
Patient does have a history of basal cell carcinoma however she has not seen you since 05/2018. The 27M window tint is legal and offered at a dealer in [place]. Its a CLEAR tint that protects UV rays.

## 2019-11-23 NOTE — Telephone Encounter (Signed)
Patient advised of information per Dr. Nehemiah Massed.   She did not schedule appointment at this time.

## 2019-12-02 ENCOUNTER — Other Ambulatory Visit: Payer: Self-pay

## 2019-12-02 MED ORDER — LEVOTHYROXINE SODIUM 137 MCG PO TABS: 137.0000 ug | ORAL_TABLET | Freq: Every day | ORAL | 3 refills | Status: DC

## 2019-12-31 ENCOUNTER — Other Ambulatory Visit: Payer: Self-pay

## 2019-12-31 MED ORDER — BUPROPION HCL ER (XL) 300 MG PO TB24: 300.0000 mg | ORAL_TABLET | Freq: Every day | ORAL | 3 refills | Status: DC

## 2020-03-31 ENCOUNTER — Other Ambulatory Visit: Payer: Self-pay

## 2020-03-31 MED ORDER — LEVOTHYROXINE SODIUM 137 MCG PO TABS: 137.0000 ug | ORAL_TABLET | Freq: Every day | ORAL | 3 refills | Status: DC

## 2020-03-31 MED ORDER — AMLODIPINE BESY-BENAZEPRIL HCL 10-40 MG PO CAPS: ORAL_CAPSULE | ORAL | 5 refills | Status: DC

## 2020-04-27 ENCOUNTER — Ambulatory Visit: Payer: PRIVATE HEALTH INSURANCE | Admitting: Dermatology

## 2020-05-02 ENCOUNTER — Other Ambulatory Visit: Payer: Self-pay

## 2020-05-02 MED ORDER — BUPROPION HCL ER (XL) 300 MG PO TB24: 300.0000 mg | ORAL_TABLET | Freq: Every day | ORAL | 1 refills | Status: DC

## 2020-05-26 ENCOUNTER — Ambulatory Visit: Payer: PRIVATE HEALTH INSURANCE | Admitting: Dermatology

## 2020-07-03 ENCOUNTER — Other Ambulatory Visit: Payer: Self-pay

## 2020-07-03 MED ORDER — BUPROPION HCL ER (XL) 300 MG PO TB24: 300.0000 mg | ORAL_TABLET | Freq: Every day | ORAL | 1 refills | Status: DC

## 2020-07-19 ENCOUNTER — Ambulatory Visit: Payer: Managed Care, Other (non HMO) | Admitting: Hospice and Palliative Medicine

## 2020-07-22 ENCOUNTER — Other Ambulatory Visit: Payer: Self-pay

## 2020-07-22 ENCOUNTER — Encounter: Payer: Self-pay | Admitting: Hospice and Palliative Medicine

## 2020-07-22 ENCOUNTER — Ambulatory Visit (INDEPENDENT_AMBULATORY_CARE_PROVIDER_SITE_OTHER): Payer: Self-pay | Admitting: Hospice and Palliative Medicine

## 2020-07-22 VITALS — BP 128/74 | HR 69 | Temp 97.6°F | Resp 16 | Ht 66.5 in | Wt 163.6 lb

## 2020-07-22 DIAGNOSIS — I493 Ventricular premature depolarization: Secondary | ICD-10-CM | POA: Diagnosis not present

## 2020-07-22 DIAGNOSIS — I10 Essential (primary) hypertension: Secondary | ICD-10-CM | POA: Diagnosis not present

## 2020-07-22 DIAGNOSIS — E039 Hypothyroidism, unspecified: Secondary | ICD-10-CM | POA: Diagnosis not present

## 2020-07-22 NOTE — Progress Notes (Signed)
[facility] [place], [place] [address]  Internal MEDICINE  Office Visit Note  Patient Name: Teresa Petty  [id number]  [id number]  Date of Service: 07/22/2020  Chief Complaint  Patient presents with  . Follow-up  . quality matrix     Mammogram, covid,flu  . Depression  . Hypertension    HPI Patient is here for routine follow-up Overall things have been going well No recent changes in medication or medical history Followed by GYN who is in charge of her annual well visit exams Followed by cardiology for HTN as well as PVCs--manages medications and refills on metoprolol as well as Lotrel Continues on levothyroxine for hypothyroidism--due for fasting labs Has mammograms in [place]--managed by GYN Colonoscopy completed 2014--repeat recommended 2024  Current Medication: Outpatient Encounter Medications as of 07/22/2020  Medication Sig  . amLODipine-benazepril (LOTREL) 10-40 MG capsule TAKE ONE CAPSULE BY MOUTH EVERY DAY FOR BLOOD PRESSURE.  Marland Kitchen buPROPion (WELLBUTRIN XL) 300 MG 24 hr tablet Take 1 tablet (300 mg total) by mouth daily.  . Cholecalciferol (VITAMIN D) 2000 UNITS tablet Take 2,000 Units by mouth daily.  Marland Kitchen desonide (DESOWEN) 0.05 % cream Apply topically 2 (two) times daily.  . folic acid (FOLVITE) 740 MCG tablet Take 400 mcg by mouth daily.  Marland Kitchen levothyroxine (SYNTHROID) 137 MCG tablet Take 1 tablet (137 mcg total) by mouth daily.  . metoprolol succinate (TOPROL-XL) 25 MG 24 hr tablet Take by mouth.  . Multiple Vitamins-Minerals (MULTIVITAMIN ADULT PO) Take by mouth.  Marland Kitchen tiZANidine (ZANAFLEX) 4 MG tablet Take 1 tablet (4 mg total) by mouth every 6 (six) hours as needed for muscle spasms.  . [DISCONTINUED] estradiol (MINIVELLE) 0.075 MG/24HR Place 0.5 patches onto the skin 2 (two) times a week.  . [DISCONTINUED] progesterone (PROMETRIUM) 200 MG capsule    No facility-administered encounter medications on file as of 07/22/2020.    Surgical  History: Past Surgical History:  Procedure Laterality Date  . APPENDECTOMY  2008   Dr Bary Castilla  . BREAST BIOPSY Left 11/19/2016   FIBROCYSTIC CHANGES WITH FEATURES OF RUPTURE AND SUBSEQUENT INFLAMMATION, CONSISTENT WITH  . COLONOSCOPY  March 2014   Dr Vira Agar  . TONSILLECTOMY      Medical History: Past Medical History:  Diagnosis Date  . Anxiety   . Basal cell carcinoma 05/07/2016   Left mid to low back 4cm lat to spine. Superficial.   . Basal cell carcinoma 05/07/2016   Upper back spinal. Superficial with focal dermal invasion.  . Basal cell carcinoma 02/21/2017   Left mid post. thigh. Superficial.   . Cancer ([place])    basel cell  . Colon polyp   . Depressive disorder   . Heart murmur   . Hx of basal cell carcinoma 1987   multiple sites   . Hypertension   . Hypothyroidism   . 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 Thyroid disease   . Thyromegaly     Family History: Family History  Problem Relation Age of Onset  . Hypertension Mother   . Hyperlipidemia Mother   . Hypertension Father   . Heart disease Father   . Cancer Sister 26       appendix  . Melanoma Sister 79    Social History   Socioeconomic History  . Marital status: Married    Spouse name: Not on file  . Number of children: Not on file  . Years of education: Not on file  . Highest education level: Not on  file  Occupational History  . Not on file  Tobacco Use  . Smoking status: Never Smoker  . Smokeless tobacco: Never Used  Substance and Sexual Activity  . Alcohol use: No  . Drug use: No  . Sexual activity: Not on file  Other Topics Concern  . Not on file  Social History Narrative  . Not on file   Social Determinants of Health   Financial Resource Strain: Not on file  Food Insecurity: Not on file  Transportation Needs: Not on file  Physical Activity: Not on file  Stress: Not on file  Social Connections: Not on file  Intimate Partner Violence: Not on file      Review of  Systems  Constitutional: Negative for chills, diaphoresis and fatigue.  HENT: Negative for ear pain, postnasal drip and sinus pressure.   Eyes: Negative for photophobia, discharge, redness, itching and visual disturbance.  Respiratory: Negative for cough, shortness of breath and wheezing.   Cardiovascular: Negative for chest pain, palpitations and leg swelling.  Gastrointestinal: Negative for abdominal pain, constipation, diarrhea, nausea and vomiting.  Genitourinary: Negative for dysuria and flank pain.  Musculoskeletal: Negative for arthralgias, back pain, gait problem and neck pain.  Skin: Negative for color change.  Allergic/Immunologic: Negative for environmental allergies and food allergies.  Neurological: Negative for dizziness and headaches.  Hematological: Does not bruise/bleed easily.  Psychiatric/Behavioral: Negative for agitation, behavioral problems (depression) and hallucinations.    Vital Signs: BP 128/74   Pulse 69   Temp 97.6 F (36.4 C)   Resp 16   Ht 5' 6.5" (1.689 m)   Wt 163 lb 9.6 oz (74.2 kg)   SpO2 98%   BMI 26.01 kg/m    Physical Exam Vitals reviewed.  Constitutional:      Appearance: Normal appearance. She is normal weight.  Cardiovascular:     Rate and Rhythm: Normal rate and regular rhythm.     Pulses: Normal pulses.     Heart sounds: Normal heart sounds.  Pulmonary:     Effort: Pulmonary effort is normal.     Breath sounds: Normal breath sounds.  Abdominal:     General: Abdomen is flat.     Palpations: Abdomen is soft.  Musculoskeletal:        General: Normal range of motion.     Cervical back: Normal range of motion.  Skin:    General: Skin is warm.  Neurological:     General: No focal deficit present.     Mental Status: She is alert and oriented to person, place, and time. Mental status is at baseline.  Psychiatric:        Mood and Affect: Mood normal.        Behavior: Behavior normal.        Thought Content: Thought content normal.         Judgment: Judgment normal.    Assessment/Plan: 1. Acquired hypothyroidism Continue with current dose of Levothyroxine--will need updated labs for further refills  2. Essential hypertension BP and HR well controlled today--managed by cardiology  3. Premature ventricular contractions Stable on metoprolol--managed by cardiology  General Counseling: Patsy Lager understanding of the findings of todays visit and agrees with plan of treatment. I have discussed any further diagnostic evaluation that may be needed or ordered today. We also reviewed her medications today. she has been encouraged to call the office with any questions or concerns that should arise related to todays visit.   Time spent: 30 Minutes Time spent includes  review of chart, medications, test results and follow-up plan with the patient.  This patient was seen by Theodoro Grist AGNP-C in Collaboration with Dr Lavera Guise as a part of collaborative care agreement     Tanna Furry. Holley Kocurek AGNP-C Internal medicine

## 2020-07-27 ENCOUNTER — Ambulatory Visit
Admission: RE | Admit: 2020-07-27 | Discharge: 2020-07-27 | Disposition: A | Discharge status: Home / Self Care | Payer: PRIVATE HEALTH INSURANCE | Source: Ambulatory Visit | Attending: Gynecology | Admitting: Gynecology

## 2020-07-27 ENCOUNTER — Other Ambulatory Visit: Payer: Self-pay | Admitting: Gynecology

## 2020-07-27 ENCOUNTER — Other Ambulatory Visit: Payer: Self-pay

## 2020-07-27 DIAGNOSIS — R599 Enlarged lymph nodes, unspecified: Secondary | ICD-10-CM

## 2020-07-30 ENCOUNTER — Encounter: Payer: Self-pay | Admitting: Hospice and Palliative Medicine

## 2020-08-01 ENCOUNTER — Other Ambulatory Visit: Payer: Self-pay | Admitting: Nurse Practitioner

## 2020-08-02 ENCOUNTER — Ambulatory Visit: Payer: Managed Care, Other (non HMO) | Admitting: Dermatology

## 2020-08-02 ENCOUNTER — Other Ambulatory Visit: Payer: Self-pay

## 2020-08-02 ENCOUNTER — Encounter: Payer: Self-pay | Admitting: Dermatology

## 2020-08-02 DIAGNOSIS — L578 Other skin changes due to chronic exposure to nonionizing radiation: Secondary | ICD-10-CM

## 2020-08-02 DIAGNOSIS — L988 Other specified disorders of the skin and subcutaneous tissue: Secondary | ICD-10-CM | POA: Diagnosis not present

## 2020-08-02 DIAGNOSIS — D492 Neoplasm of unspecified behavior of bone, soft tissue, and skin: Secondary | ICD-10-CM

## 2020-08-02 DIAGNOSIS — L720 Epidermal cyst: Secondary | ICD-10-CM

## 2020-08-02 DIAGNOSIS — C44311 Basal cell carcinoma of skin of nose: Secondary | ICD-10-CM

## 2020-08-02 DIAGNOSIS — Z85828 Personal history of other malignant neoplasm of skin: Secondary | ICD-10-CM | POA: Diagnosis not present

## 2020-08-02 DIAGNOSIS — L570 Actinic keratosis: Secondary | ICD-10-CM

## 2020-08-02 NOTE — Progress Notes (Signed)
   Follow-Up Visit   Subjective  Teresa Petty is a 65 y.o. female who presents for the following: dry scaly spot (Upper lip ~55m, moisturizer) and Thickened area (R nostril, ~72m).  The following portions of the chart were reviewed this encounter and updated as appropriate:   Tobacco  Allergies  Meds  Problems  Med Hx  Surg Hx  Fam Hx     Review of Systems:  No other skin or systemic complaints except as noted in HPI or Assessment and Plan.  Objective  Well appearing patient in no apparent distress; mood and affect are within normal limits.  A focused examination was performed including face. Relevant physical exam findings are noted in the Assessment and Plan.  Objective  R of midline upper lip vermillion border x 1: Pink scaly macules   Objective  R chin: White pap  Objective  Right nasal alar rim: Flesh colored pap ~0.6cm       Objective  upper lip: Rhytides and volume loss.    Assessment & Plan    Actinic Damage - chronic, secondary to cumulative UV radiation exposure/sun exposure over time - diffuse scaly erythematous macules with underlying dyspigmentation - Recommend daily broad spectrum sunscreen SPF 30+ to sun-exposed areas, reapply every 2 hours as needed.  - Call for new or changing lesions.  History of Basal Cell Carcinoma of the Skin - No evidence of recurrence today - Recommend regular full body skin exams - Recommend daily broad spectrum sunscreen SPF 30+ to sun-exposed areas, reapply every 2 hours as needed.  - Call if any new or changing lesions are noted between office visits  AK (actinic keratosis) R of midline upper lip vermillion border x 1  Destruction of lesion - R of midline upper lip vermillion border x 1 Complexity: simple   Destruction method: cryotherapy   Informed consent: discussed and consent obtained   Timeout:  patient name, date of birth, surgical site, and procedure verified Lesion destroyed using liquid nitrogen:  Yes   Region frozen until ice ball extended beyond lesion: Yes   Outcome: patient tolerated procedure well with no complications   Post-procedure details: wound care instructions given    Milia R chin Benign, observe  Neoplasm of skin Right nasal alar rim  Skin / nail biopsy Type of biopsy: tangential   Informed consent: discussed and consent obtained   Timeout: patient name, date of birth, surgical site, and procedure verified   Procedure prep:  Patient was prepped and draped in usual sterile fashion Prep type:  Isopropyl alcohol Anesthesia: the lesion was anesthetized in a standard fashion   Anesthetic:  1% lidocaine w/ epinephrine 1-100,000 buffered w/ 8.4% NaHCO3 Instrument used: flexible razor blade   Outcome: patient tolerated procedure well   Post-procedure details: sterile dressing applied and wound care instructions given   Dressing type: bandage and bacitracin    Specimen 1 - Surgical pathology Differential Diagnosis: D48.5 Cyst vs Sebaceus hyperplasia r/o BCC Check Margins: No Flesh colored pap ~0.6cm  Cyst vs Sebaceous Hyperplasia r/o BCC  Elastosis of skin upper lip Discussed Botox to upper lip Discussed Halo laser Discussed SPF  Return in about 2 months (around 09/30/2020) for AK f/u.  I, Othelia Pulling, RMA, am acting as scribe for Sarina Ser, MD .  Documentation: I have reviewed the above documentation for accuracy and completeness, and I agree with the above.  Sarina Ser, MD

## 2020-08-02 NOTE — Patient Instructions (Signed)

## 2020-08-11 ENCOUNTER — Telehealth: Payer: Self-pay

## 2020-08-11 DIAGNOSIS — C44311 Basal cell carcinoma of skin of nose: Secondary | ICD-10-CM

## 2020-08-11 NOTE — Telephone Encounter (Signed)
Patient advised of results per MyChart readings. Referral sent to Dr. Lacinda Axon for [facility].

## 2020-08-11 NOTE — Telephone Encounter (Signed)
-----   Message from Ralene Bathe, MD sent at 08/08/2020 12:47 PM EST ----- Diagnosis Skin , right nasal alar rim BASAL CELL CARCINOMA, NODULAR PATTERN  Cancer - BCC Schedule for MOHS

## 2020-09-28 ENCOUNTER — Ambulatory Visit: Payer: Self-pay | Admitting: Dermatology

## 2020-10-13 ENCOUNTER — Other Ambulatory Visit: Payer: Managed Care, Other (non HMO)

## 2020-10-13 DIAGNOSIS — E756 Lipid storage disorder, unspecified: Secondary | ICD-10-CM | POA: Diagnosis not present

## 2020-10-13 DIAGNOSIS — E538 Deficiency of other specified B group vitamins: Secondary | ICD-10-CM

## 2020-10-13 DIAGNOSIS — R5383 Other fatigue: Secondary | ICD-10-CM | POA: Diagnosis not present

## 2020-10-13 DIAGNOSIS — R6889 Other general symptoms and signs: Secondary | ICD-10-CM

## 2020-10-13 DIAGNOSIS — E038 Other specified hypothyroidism: Secondary | ICD-10-CM

## 2020-10-14 LAB — COMPREHENSIVE METABOLIC PANEL
ALT: 25 IU/L (ref 0–32)
AST: 21 IU/L (ref 0–40)
Albumin/Globulin Ratio: 1.8 (ref 1.2–2.2)
Albumin: 4.2 g/dL (ref 3.8–4.8)
Alkaline Phosphatase: 68 IU/L (ref 44–121)
BUN/Creatinine Ratio: 10 — ABNORMAL LOW (ref 12–28)
BUN: 8 mg/dL (ref 8–27)
Bilirubin Total: 0.4 mg/dL (ref 0.0–1.2)
CO2: 24 mmol/L (ref 20–29)
Calcium: 9.6 mg/dL (ref 8.7–10.3)
Chloride: 96 mmol/L (ref 96–106)
Creatinine, Ser: 0.84 mg/dL (ref 0.57–1.00)
Globulin, Total: 2.4 g/dL (ref 1.5–4.5)
Glucose: 99 mg/dL (ref 65–99)
Potassium: 4.9 mmol/L (ref 3.5–5.2)
Sodium: 132 mmol/L — ABNORMAL LOW (ref 134–144)
Total Protein: 6.6 g/dL (ref 6.0–8.5)
eGFR: 78 mL/min/{1.73_m2} (ref >59)

## 2020-10-14 LAB — LIPID PANEL
Chol/HDL Ratio: 2.4 ratio (ref 0.0–4.4)
Cholesterol, Total: 174 mg/dL (ref 100–199)
HDL: 73 mg/dL (ref >39)
LDL Chol Calc (NIH): 89 mg/dL (ref 0–99)
Triglycerides: 61 mg/dL (ref 0–149)
VLDL Cholesterol Cal: 12 mg/dL (ref 5–40)

## 2020-10-14 LAB — TSH+FREE T4
Free T4: 1.52 ng/dL (ref 0.82–1.77)
TSH: 1.13 u[IU]/mL (ref 0.450–4.500)

## 2020-10-14 LAB — CBC WITH DIFFERENTIAL/PLATELET
Basophils Absolute: 0 10*3/uL (ref 0.0–0.2)
Basos: 1 %
EOS (ABSOLUTE): 0.2 10*3/uL (ref 0.0–0.4)
Eos: 4 %
Hematocrit: 37.1 % (ref 34.0–46.6)
Hemoglobin: 12.6 g/dL (ref 11.1–15.9)
Immature Grans (Abs): 0 10*3/uL (ref 0.0–0.1)
Immature Granulocytes: 0 %
Lymphocytes Absolute: 1.5 10*3/uL (ref 0.7–3.1)
Lymphs: 35 %
MCH: 30.4 pg (ref 26.6–33.0)
MCHC: 34 g/dL (ref 31.5–35.7)
MCV: 90 fL (ref 79–97)
Monocytes Absolute: 0.3 10*3/uL (ref 0.1–0.9)
Monocytes: 7 %
Neutrophils Absolute: 2.2 10*3/uL (ref 1.4–7.0)
Neutrophils: 53 %
Platelets: 331 10*3/uL (ref 150–450)
RBC: 4.14 x10E6/uL (ref 3.77–5.28)
RDW: 12 % (ref 11.7–15.4)
WBC: 4.2 10*3/uL (ref 3.4–10.8)

## 2020-10-14 LAB — VITAMIN B12: Vitamin B-12: 770 pg/mL (ref 232–1245)

## 2020-10-14 LAB — VITAMIN D 25 HYDROXY (VIT D DEFICIENCY, FRACTURES): Vit D, 25-Hydroxy: 89.7 ng/mL (ref 30.0–100.0)

## 2020-10-18 ENCOUNTER — Telehealth: Payer: Self-pay

## 2020-10-18 NOTE — Telephone Encounter (Signed)
Spoke to pt, informed her labs looked within normal limits except sodium is low. Pt does drink a lot of water. Informed her labs may need to be repeated. When I asked her how she was feeling she said since her BP med was changed she has headaches everyday.

## 2020-10-18 NOTE — Telephone Encounter (Signed)
LMOM for pt not to drink more than 1.5L of water a day and to repeat labs in a few weeks. Pt needs a f/u appt

## 2020-10-18 NOTE — Telephone Encounter (Signed)
-----   Message from Fozia M Khan, MD sent at 10/18/2020  3:04 PM EDT ----- Please advise pt her lab looked within normal limits except sodium is low, ask her if she is feeling okay and if drinks too much water , will have to repeat in few weeks. Please let me know so I can order   

## 2020-10-18 NOTE — Telephone Encounter (Signed)
Can we schedule her a follow up, please tell  her Do not drink more than 1.5 L of water a day

## 2020-10-19 ENCOUNTER — Telehealth: Payer: Self-pay

## 2020-10-19 NOTE — Telephone Encounter (Signed)
-----   Message from Lavera Guise, MD sent at 10/18/2020  3:04 PM EDT ----- Please advise pt her lab looked within normal limits except sodium is low, ask her if she is feeling okay and if drinks too much water , will have to repeat in few weeks. Please let me know so I can order

## 2020-10-21 NOTE — Telephone Encounter (Signed)
Spoke to pt, she is aware and will make f/u appt after she completes labs

## 2020-10-27 ENCOUNTER — Other Ambulatory Visit: Payer: Managed Care, Other (non HMO)

## 2020-10-27 DIAGNOSIS — E871 Hypo-osmolality and hyponatremia: Secondary | ICD-10-CM

## 2020-10-28 LAB — BASIC METABOLIC PANEL
BUN/Creatinine Ratio: 15 (ref 12–28)
BUN: 14 mg/dL (ref 8–27)
CO2: 20 mmol/L (ref 20–29)
Calcium: 9.2 mg/dL (ref 8.7–10.3)
Chloride: 100 mmol/L (ref 96–106)
Creatinine, Ser: 0.95 mg/dL (ref 0.57–1.00)
Glucose: 110 mg/dL — ABNORMAL HIGH (ref 65–99)
Potassium: 4 mmol/L (ref 3.5–5.2)
Sodium: 134 mmol/L (ref 134–144)
eGFR: 67 mL/min/{1.73_m2} (ref >59)

## 2020-10-28 NOTE — Progress Notes (Signed)
Improved sodium but glucose is mildly elevated, will keep an eye on it

## 2020-12-05 ENCOUNTER — Other Ambulatory Visit: Payer: Self-pay

## 2020-12-05 MED ORDER — LEVOTHYROXINE SODIUM 137 MCG PO TABS
137.0000 ug | ORAL_TABLET | Freq: Every day | ORAL | 3 refills | Status: DC
Start: 2020-12-05 — End: 2021-04-04

## 2021-01-16 ENCOUNTER — Ambulatory Visit: Payer: Managed Care, Other (non HMO) | Admitting: Dermatology

## 2021-02-20 ENCOUNTER — Other Ambulatory Visit: Payer: Self-pay | Admitting: Internal Medicine

## 2021-03-10 ENCOUNTER — Telehealth: Payer: Self-pay

## 2021-03-13 ENCOUNTER — Other Ambulatory Visit: Payer: Self-pay

## 2021-03-13 MED ORDER — BUPROPION HCL ER (XL) 150 MG PO TB24: 150.0000 mg | ORAL_TABLET | Freq: Every day | ORAL | 1 refills | Status: DC

## 2021-03-13 NOTE — Telephone Encounter (Signed)
Its ok we can send new RX

## 2021-03-13 NOTE — Telephone Encounter (Signed)
Called and informed pt that per [facility] we are decreasing her bupropion from 300 mg down to 150 mg as she was requesting, advised that I sent in new prescription to her pharmacy

## 2021-04-04 ENCOUNTER — Other Ambulatory Visit: Payer: Self-pay | Admitting: Internal Medicine

## 2021-06-01 ENCOUNTER — Encounter: Payer: Self-pay | Admitting: Internal Medicine

## 2021-07-21 ENCOUNTER — Encounter: Payer: Self-pay | Admitting: Physician Assistant

## 2021-07-21 ENCOUNTER — Other Ambulatory Visit: Payer: Self-pay

## 2021-07-21 ENCOUNTER — Ambulatory Visit: Payer: Medicare HMO | Admitting: Nurse Practitioner

## 2021-07-21 VITALS — BP 103/62 | HR 65 | Temp 98.3°F | Resp 16 | Ht 66.5 in | Wt 162.0 lb

## 2021-07-21 DIAGNOSIS — F411 Generalized anxiety disorder: Secondary | ICD-10-CM

## 2021-07-21 DIAGNOSIS — F3341 Major depressive disorder, recurrent, in partial remission: Secondary | ICD-10-CM | POA: Diagnosis not present

## 2021-07-21 DIAGNOSIS — I10 Essential (primary) hypertension: Secondary | ICD-10-CM | POA: Diagnosis not present

## 2021-07-21 DIAGNOSIS — E039 Hypothyroidism, unspecified: Secondary | ICD-10-CM

## 2021-07-21 DIAGNOSIS — Z780 Asymptomatic menopausal state: Secondary | ICD-10-CM | POA: Diagnosis not present

## 2021-07-21 MED ORDER — FOLIC ACID 1 MG PO TABS: 1.0000 mg | ORAL_TABLET | Freq: Every day | ORAL | 2 refills | Status: DC

## 2021-07-21 MED ORDER — LEVOTHYROXINE SODIUM 137 MCG PO TABS: 137.0000 ug | ORAL_TABLET | Freq: Every day | ORAL | 3 refills | Status: DC

## 2021-07-21 MED ORDER — BUPROPION HCL ER (XL) 300 MG PO TB24: 300.0000 mg | ORAL_TABLET | Freq: Every day | ORAL | 1 refills | Status: DC

## 2021-07-21 NOTE — Progress Notes (Signed)
[facility] [place], [place] [address]  Internal MEDICINE  Office Visit Note  Patient Name: Teresa Petty  [id number]  [id number]  Date of Service: 07/21/2021  Chief Complaint  Patient presents with   Follow-up   Depression   Hypertension   Quality Metric Gaps    Pt would like a bone density exam    HPI Teresa Petty presents for a follow up visit for post covid symptoms, anxiety, BMD screening and hypertension. She had covid in October 2022 and still continues to have low grade fevers and palpitations off and on since then. She is being followed by cardiology. Patient has a history of palpitations prior to having covid in October 2022 and has been followed by cardiology for palpitations and PVCs.  She is asking for her wellbutrin dose to be increased. Patient reports that her husband recently retired and is at home more and her anxiety has increased because he can be irritable at times.  Patient would like to be screened for osteoporosis.  She is due for colonoscopy in June or July this year. She has a dermatology appointment on this coming Monday and will see gynecology in may for her well woman exam.  Her blood pressure is well controlled with current medication.    Current Medication: Outpatient Encounter Medications as of 07/21/2021  Medication Sig   buPROPion (WELLBUTRIN XL) 300 MG 24 hr tablet Take 1 tablet (300 mg total) by mouth daily.   Cholecalciferol (VITAMIN D) 2000 UNITS tablet Take 2,000 Units by mouth daily.   desonide (DESOWEN) 0.05 % cream Apply topically 2 (two) times daily.   folic acid (FOLVITE) 1 MG tablet Take 1 tablet (1 mg total) by mouth daily.   Multiple Vitamins-Minerals (MULTIVITAMIN ADULT PO) Take by mouth.   [DISCONTINUED] buPROPion (WELLBUTRIN XL) 150 MG 24 hr tablet Take 1 tablet (150 mg total) by mouth daily.   [DISCONTINUED] folic acid (FOLVITE) 160 MCG tablet Take 400 mcg by mouth daily.   [DISCONTINUED] levothyroxine  (SYNTHROID) 137 MCG tablet TAKE 1 TABLET BY MOUTH DAILY   amLODipine-benazepril (LOTREL) 10-40 MG capsule TAKE ONE CAPSULE BY MOUTH EVERY DAY FOR BLOOD PRESSURE.   levothyroxine (SYNTHROID) 137 MCG tablet Take 1 tablet (137 mcg total) by mouth daily.   metoprolol succinate (TOPROL-XL) 25 MG 24 hr tablet Take by mouth.   No facility-administered encounter medications on file as of 07/21/2021.    Surgical History: Past Surgical History:  Procedure Laterality Date   APPENDECTOMY  2008   Dr Bary Castilla   BREAST BIOPSY Left 11/19/2016   FIBROCYSTIC CHANGES WITH FEATURES OF RUPTURE AND SUBSEQUENT INFLAMMATION, CONSISTENT WITH   COLONOSCOPY  March 2014   Dr Vira Agar   TONSILLECTOMY      Medical History: Past Medical History:  Diagnosis Date   Anxiety    Basal cell carcinoma 05/07/2016   Left mid to low back 4cm lat to spine. Superficial.    Basal cell carcinoma 05/07/2016   Upper back spinal. Superficial with focal dermal invasion.   Basal cell carcinoma 02/21/2017   Left mid post. thigh. Superficial.    Basal cell carcinoma 08/02/2020   R nasal alar rim - scheduled for MOHS    Cancer (HCC)    basel cell   Colon polyp    Depressive disorder    Heart murmur    Hx of basal cell carcinoma 1987   multiple sites    Hypertension    Hypothyroidism    Insomnia due to mental  disorder(327.02)    Thyroid disease    Thyromegaly     Family History: Family History  Problem Relation Age of Onset   Hypertension Mother    Hyperlipidemia Mother    Hypertension Father    Heart disease Father    Cancer Sister 43       appendix   Melanoma Sister 54    Social History   Socioeconomic History   Marital status: Married    Spouse name: Not on file   Number of children: Not on file   Years of education: Not on file   Highest education level: Not on file  Occupational History   Not on file  Tobacco Use   Smoking status: Never   Smokeless tobacco: Never  Substance and Sexual Activity    Alcohol use: No   Drug use: No   Sexual activity: Not on file  Other Topics Concern   Not on file  Social History Narrative   Not on file   Social Determinants of Health   Financial Resource Strain: Not on file  Food Insecurity: Not on file  Transportation Needs: Not on file  Physical Activity: Not on file  Stress: Not on file  Social Connections: Not on file  Intimate Partner Violence: Not on file      Review of Systems  Constitutional:  Negative for chills, fatigue and unexpected weight change.  HENT:  Negative for congestion, rhinorrhea, sneezing and sore throat.   Eyes:  Negative for redness.  Respiratory:  Negative for cough, chest tightness and shortness of breath.   Cardiovascular:  Negative for chest pain and palpitations.  Gastrointestinal:  Negative for abdominal pain, constipation, diarrhea, nausea and vomiting.  Genitourinary:  Negative for dysuria and frequency.  Musculoskeletal:  Negative for arthralgias, back pain, joint swelling and neck pain.  Skin:  Negative for rash.  Neurological: Negative.  Negative for tremors and numbness.  Hematological:  Negative for adenopathy. Does not bruise/bleed easily.  Psychiatric/Behavioral:  Negative for behavioral problems (Depression), sleep disturbance and suicidal ideas. The patient is not nervous/anxious.    Vital Signs: BP 103/62    Pulse 65    Temp 98.3 F (36.8 C)    Resp 16    Ht 5' 6.5" (1.689 m)    Wt 162 lb (73.5 kg)    SpO2 99%    BMI 25.76 kg/m    Physical Exam Vitals reviewed.  Constitutional:      Appearance: Normal appearance. She is normal weight.  HENT:     Head: Normocephalic and atraumatic.  Eyes:     Pupils: Pupils are equal, round, and reactive to light.  Cardiovascular:     Rate and Rhythm: Normal rate and regular rhythm.  Pulmonary:     Effort: Pulmonary effort is normal. No respiratory distress.  Neurological:     Mental Status: She is alert and oriented to person, place, and time.   Psychiatric:        Mood and Affect: Mood normal.        Behavior: Behavior normal.       Assessment/Plan: 1. Essential hypertension  Blood pressure is stable with current medications.   2. Acquired hypothyroidism Stable, refills ordered  - levothyroxine (SYNTHROID) 137 MCG tablet; Take 1 tablet (137 mcg total) by mouth daily.  Dispense: 90 tablet; Refill: 3  3. Asymptomatic menopausal state Dexa scan ordered. - DG Bone Density; Future  4. Recurrent major depressive disorder, in partial remission (HCC) Bupropion dose increased.  -  buPROPion (WELLBUTRIN XL) 300 MG 24 hr tablet; Take 1 tablet (300 mg total) by mouth daily.  Dispense: 90 tablet; Refill: 1  5. Generalized anxiety disorder Bupropion dose increased.  - buPROPion (WELLBUTRIN XL) 300 MG 24 hr tablet; Take 1 tablet (300 mg total) by mouth daily.  Dispense: 90 tablet; Refill: 1    General Counseling: Dorrene verbalizes understanding of the findings of todays visit and agrees with plan of treatment. I have discussed any further diagnostic evaluation that may be needed or ordered today. We also reviewed her medications today. she has been encouraged to call the office with any questions or concerns that should arise related to todays visit.    Orders Placed This Encounter  Procedures   DG Bone Density    Meds ordered this encounter  Medications   levothyroxine (SYNTHROID) 137 MCG tablet    Sig: Take 1 tablet (137 mcg total) by mouth daily.    Dispense:  90 tablet    Refill:  3   buPROPion (WELLBUTRIN XL) 300 MG 24 hr tablet    Sig: Take 1 tablet (300 mg total) by mouth daily.    Dispense:  90 tablet    Refill:  1   folic acid (FOLVITE) 1 MG tablet    Sig: Take 1 tablet (1 mg total) by mouth daily.    Dispense:  90 tablet    Refill:  2    Return in about 3 months (around 10/18/2021) for medicare wellness exam and labs in may with lauren or Treven Holtman .   Total time spent:30 Minutes Time spent includes review  of chart, medications, test results, and follow up plan with the patient.   [place] Controlled Substance Database was reviewed by me.  This patient was seen by Jonetta Osgood, FNP-C in collaboration with Dr. Clayborn Bigness as a part of collaborative care agreement.   Jayleigh Notarianni R. Valetta Fuller, MSN, FNP-C Internal medicine

## 2021-09-19 ENCOUNTER — Ambulatory Visit
Admission: RE | Admit: 2021-09-19 | Discharge: 2021-09-19 | Disposition: A | Discharge status: Home / Self Care | Payer: Medicare HMO | Source: Ambulatory Visit | Attending: Nurse Practitioner | Admitting: Nurse Practitioner

## 2021-09-19 DIAGNOSIS — Z780 Asymptomatic menopausal state: Secondary | ICD-10-CM | POA: Diagnosis present

## 2021-09-25 NOTE — Progress Notes (Signed)
Results will be discussed with patient at her next office visit.

## 2021-10-16 ENCOUNTER — Telehealth: Payer: Self-pay

## 2021-10-16 NOTE — Telephone Encounter (Signed)
Left vm to confirm 10/23/21 appointment-[name] ?

## 2021-10-23 ENCOUNTER — Ambulatory Visit (INDEPENDENT_AMBULATORY_CARE_PROVIDER_SITE_OTHER): Payer: Managed Care, Other (non HMO) | Admitting: Nurse Practitioner

## 2021-10-23 ENCOUNTER — Encounter: Payer: Self-pay | Admitting: Nurse Practitioner

## 2021-10-23 VITALS — BP 119/73 | HR 66 | Temp 98.7°F | Resp 16 | Ht 66.5 in | Wt 161.0 lb

## 2021-10-23 DIAGNOSIS — F3341 Major depressive disorder, recurrent, in partial remission: Secondary | ICD-10-CM

## 2021-10-23 DIAGNOSIS — F411 Generalized anxiety disorder: Secondary | ICD-10-CM

## 2021-10-23 DIAGNOSIS — E782 Mixed hyperlipidemia: Secondary | ICD-10-CM

## 2021-10-23 DIAGNOSIS — I10 Essential (primary) hypertension: Secondary | ICD-10-CM

## 2021-10-23 DIAGNOSIS — Z0001 Encounter for general adult medical examination with abnormal findings: Secondary | ICD-10-CM | POA: Diagnosis not present

## 2021-10-23 DIAGNOSIS — E039 Hypothyroidism, unspecified: Secondary | ICD-10-CM

## 2021-10-23 DIAGNOSIS — R300 Dysuria: Secondary | ICD-10-CM

## 2021-10-23 MED ORDER — BUPROPION HCL ER (XL) 300 MG PO TB24: 300.0000 mg | ORAL_TABLET | Freq: Every day | ORAL | 1 refills | Status: DC

## 2021-10-23 NOTE — Progress Notes (Unsigned)
[facility] [place], [place] [address]  Internal MEDICINE  Office Visit Note  Patient Name: Teresa Petty  [id number]  [id number]  Date of Service: 10/23/2021  Chief Complaint  Patient presents with   Medicare Wellness   Depression   Hypertension   Anxiety    HPI [name] presents for an annual well visit and physical exam.  She is a well-appearing 66 year old female with hypertension, hypothyroidism and history of depression. Her blood pressure and other vital signs are stable and within normal limits.  She is being followed by cardiology for her hypertension, PVCs, and heart palpitations.  She had her Pap smear done at [facility] last week.  She also had her routine mammogram and bone density scan done last week as well.  Her bone density scan showed osteopenia of the femur and she is already taking a calcium and vitamin D supplement.  She is due for a routine colonoscopy this year and this is already set up and scheduled per patient report. She is due for routine labs and no other preventive screenings are due at this time.  She has no other concerns.  She denies any new or worsening pain.   Current Medication: Outpatient Encounter Medications as of 10/23/2021  Medication Sig   amLODipine-benazepril (LOTREL) 10-40 MG capsule TAKE ONE CAPSULE BY MOUTH EVERY DAY FOR BLOOD PRESSURE.   amLODipine-benazepril (LOTREL) 10-40 MG capsule Take 1 capsule by mouth daily.   Cholecalciferol (VITAMIN D) 2000 UNITS tablet Take 2,000 Units by mouth daily.   desonide (DESOWEN) 0.05 % cream Apply topically 2 (two) times daily.   folic acid (FOLVITE) 1 MG tablet Take 1 tablet (1 mg total) by mouth daily.   levothyroxine (SYNTHROID) 137 MCG tablet Take 1 tablet (137 mcg total) by mouth daily.   metoprolol succinate (TOPROL-XL) 25 MG 24 hr tablet Take 1 tablet by mouth daily.   Multiple Vitamins-Minerals (MULTIVITAMIN ADULT PO) Take by mouth.   [DISCONTINUED] buPROPion  (WELLBUTRIN XL) 300 MG 24 hr tablet Take 1 tablet (300 mg total) by mouth daily.   buPROPion (WELLBUTRIN XL) 300 MG 24 hr tablet Take 1 tablet by mouth daily.   buPROPion (WELLBUTRIN XL) 300 MG 24 hr tablet Take 1 tablet (300 mg total) by mouth daily.   metoprolol succinate (TOPROL-XL) 25 MG 24 hr tablet Take by mouth.   No facility-administered encounter medications on file as of 10/23/2021.    Surgical History: Past Surgical History:  Procedure Laterality Date   APPENDECTOMY  2008   Dr Bary Castilla   BREAST BIOPSY Left 11/19/2016   FIBROCYSTIC CHANGES WITH FEATURES OF RUPTURE AND SUBSEQUENT INFLAMMATION, CONSISTENT WITH   COLONOSCOPY  March 2014   Dr Vira Agar   TONSILLECTOMY      Medical History: Past Medical History:  Diagnosis Date   Anxiety    Basal cell carcinoma 05/07/2016   Left mid to low back 4cm lat to spine. Superficial.    Basal cell carcinoma 05/07/2016   Upper back spinal. Superficial with focal dermal invasion.   Basal cell carcinoma 02/21/2017   Left mid post. thigh. Superficial.    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)    Thyroid disease    Thyromegaly     Family History: Family History  Problem Relation Age of Onset   Hypertension Mother    Hyperlipidemia Mother    Hypertension Father    Heart disease Father    Cancer Sister 14       appendix   Melanoma Sister 11    Social History   Socioeconomic History   Marital status: Married    Spouse name: Not on file   Number of children: Not on file   Years of education: Not on file   Highest education level: Not on file  Occupational History   Not on file  Tobacco Use   Smoking status: Never   Smokeless tobacco: Never  Substance and Sexual Activity    Alcohol use: No   Drug use: No   Sexual activity: Not on file  Other Topics Concern   Not on file  Social History Narrative   Not on file   Social Determinants of Health   Financial Resource Strain: Not on file  Food Insecurity: Not on file  Transportation Needs: Not on file  Physical Activity: Not on file  Stress: Not on file  Social Connections: Not on file  Intimate Partner Violence: Not on file      Review of Systems  Constitutional:  Negative for activity change, appetite change, chills, fatigue, fever and unexpected weight change.  HENT: Negative.  Negative for congestion, ear pain, rhinorrhea, sore throat and trouble swallowing.   Eyes: Negative.   Respiratory: Negative.  Negative for cough, chest tightness, shortness of breath and wheezing.   Cardiovascular: Negative.  Negative for chest pain.  Gastrointestinal: Negative.  Negative for abdominal pain, blood in stool, constipation, diarrhea, nausea and vomiting.  Endocrine: Negative.   Genitourinary: Negative.  Negative for difficulty urinating, dysuria, frequency, hematuria and urgency.  Musculoskeletal: Negative.  Negative for arthralgias, back pain, joint swelling, myalgias and neck pain.  Skin: Negative.  Negative for rash and wound.  Allergic/Immunologic: Negative.  Negative for immunocompromised state.  Neurological: Negative.  Negative for dizziness, seizures, numbness and headaches.  Hematological: Negative.   Psychiatric/Behavioral:  Positive for depression. Negative for behavioral problems, self-injury and suicidal ideas. The patient is not nervous/anxious.    Vital Signs: BP 119/73   Pulse 66   Temp 98.7 F (37.1 C)   Resp 16   Ht 5' 6.5" (1.689 m)   Wt 161 lb (73 kg)   SpO2 99%   BMI 25.60 kg/m    Physical Exam Vitals reviewed.  Constitutional:      General: She is awake. She is not in acute distress.    Appearance: Normal appearance. She is well-developed, well-groomed and normal weight. She  is not ill-appearing or diaphoretic.  HENT:     Head: Normocephalic and atraumatic.     Right Ear: Tympanic membrane, ear canal and external ear normal.     Left Ear: Tympanic membrane, ear canal and external ear normal.     Nose: Nose normal. No congestion or rhinorrhea.     Mouth/Throat:     Lips: Pink.     Mouth: Mucous membranes are moist.     Pharynx: Oropharynx is clear. Uvula midline. No oropharyngeal exudate or posterior oropharyngeal erythema.  Eyes:     General: Lids are normal. Vision grossly intact. Gaze aligned appropriately. No scleral icterus.       Right eye: No discharge.  Left eye: No discharge.     Extraocular Movements: Extraocular movements intact.     Conjunctiva/sclera: Conjunctivae normal.     Pupils: Pupils are equal, round, and reactive to light.     Funduscopic exam:    Right eye: Red reflex present.        Left eye: Red reflex present. Neck:     Thyroid: No thyromegaly.     Vascular: No carotid bruit or JVD.     Trachea: Trachea and phonation normal. No tracheal deviation.  Cardiovascular:     Rate and Rhythm: Normal rate and regular rhythm.     Pulses: Normal pulses.     Heart sounds: Normal heart sounds, S1 normal and S2 normal. No murmur heard.    No friction rub. No gallop.  Pulmonary:     Effort: Pulmonary effort is normal. No accessory muscle usage or respiratory distress.     Breath sounds: Normal breath sounds and air entry. No stridor. No wheezing or rales.  Chest:     Chest wall: No tenderness.     Comments: Goes to OBGYN Abdominal:     General: Bowel sounds are normal. There is no distension.     Palpations: Abdomen is soft. There is no shifting dullness, fluid wave, mass or pulsatile mass.     Tenderness: There is no abdominal tenderness. There is no guarding or rebound.  Musculoskeletal:        General: No tenderness or deformity. Normal range of motion.     Cervical back: Normal range of motion and neck supple.     Right lower  leg: No edema.     Left lower leg: No edema.  Lymphadenopathy:     Cervical: No cervical adenopathy.  Skin:    General: Skin is warm and dry.     Capillary Refill: Capillary refill takes less than 2 seconds.     Coloration: Skin is not pale.     Findings: No erythema or rash.  Neurological:     Mental Status: She is alert and oriented to person, place, and time.     Cranial Nerves: No cranial nerve deficit.     Motor: No abnormal muscle tone.     Coordination: Coordination normal.     Gait: Gait normal.     Deep Tendon Reflexes: Reflexes are normal and symmetric.  Psychiatric:        Mood and Affect: Mood normal.        Behavior: Behavior normal. Behavior is cooperative.        Thought Content: Thought content normal.        Judgment: Judgment normal.        Assessment/Plan: 1. Encounter for routine adult health examination with abnormal findings Age-appropriate preventive screenings and vaccinations discussed, annual physical exam completed. Routine labs for health maintenance ordered, see below. PHM updated.  - CBC with Differential/Platelet - CMP14+EGFR - Lipid Profile - TSH + free T4 - magnesium oxide (MAG-OX) 400 MG tablet; Take by mouth.  2. Essential hypertension Blood pressure is stable and controlled on current medications, she is followed by cardiology, routine labs ordered. - amLODipine-benazepril (LOTREL) 10-40 MG capsule; Take 1 capsule by mouth daily. - metoprolol succinate (TOPROL-XL) 25 MG 24 hr tablet; Take 1 tablet by mouth daily. - CBC with Differential/Platelet - CMP14+EGFR - Lipid Profile - TSH + free T4  3. Acquired hypothyroidism Routine labs ordered - CBC with Differential/Platelet - CMP14+EGFR - Lipid Profile - TSH + free T4  4.  Mixed hyperlipidemia Routine labs ordered - CBC with Differential/Platelet - CMP14+EGFR - Lipid Profile  5. Dysuria Routine urinalysis done - UA/M w/rflx Culture, Routine  6. Recurrent major depressive  disorder, in partial remission (HCC) Stable, bupropion refilled - buPROPion (WELLBUTRIN XL) 300 MG 24 hr tablet; Take 1 tablet (300 mg total) by mouth daily.  Dispense: 90 tablet; Refill: 1  7. Generalized anxiety disorder Stable, bupropion refilled - buPROPion (WELLBUTRIN XL) 300 MG 24 hr tablet; Take 1 tablet (300 mg total) by mouth daily.  Dispense: 90 tablet; Refill: 1     General Counseling: Zahava verbalizes understanding of the findings of todays visit and agrees with plan of treatment. I have discussed any further diagnostic evaluation that may be needed or ordered today. We also reviewed her medications today. she has been encouraged to call the office with any questions or concerns that should arise related to todays visit.    Orders Placed This Encounter  Procedures   CBC with Differential/Platelet   CMP14+EGFR   Lipid Profile   TSH + free T4   POCT Urinalysis Dipstick    Meds ordered this encounter  Medications   buPROPion (WELLBUTRIN XL) 300 MG 24 hr tablet    Sig: Take 1 tablet (300 mg total) by mouth daily.    Dispense:  90 tablet    Refill:  1    Return in about 1 year (around 10/24/2022) for CPE.   Total time spent:30 Minutes Time spent includes review of chart, medications, test results, and follow up plan with the patient.   [place] Controlled Substance Database was reviewed by me.  This patient was seen by Jonetta Osgood, FNP-C in collaboration with Dr. Clayborn Bigness as a part of collaborative care agreement.  Josemanuel Eakins R. Valetta Fuller, MSN, FNP-C Internal medicine

## 2021-10-24 LAB — CMP14+EGFR
ALT: 24 IU/L (ref 0–32)
AST: 25 IU/L (ref 0–40)
Albumin/Globulin Ratio: 1.6 (ref 1.2–2.2)
Albumin: 4.6 g/dL (ref 3.8–4.8)
Alkaline Phosphatase: 93 IU/L (ref 44–121)
BUN/Creatinine Ratio: 12 (ref 12–28)
BUN: 12 mg/dL (ref 8–27)
Bilirubin Total: 0.4 mg/dL (ref 0.0–1.2)
CO2: 22 mmol/L (ref 20–29)
Calcium: 10 mg/dL (ref 8.7–10.3)
Chloride: 98 mmol/L (ref 96–106)
Creatinine, Ser: 1.02 mg/dL — ABNORMAL HIGH (ref 0.57–1.00)
Globulin, Total: 2.9 g/dL (ref 1.5–4.5)
Glucose: 103 mg/dL — ABNORMAL HIGH (ref 70–99)
Potassium: 5.1 mmol/L (ref 3.5–5.2)
Sodium: 135 mmol/L (ref 134–144)
Total Protein: 7.5 g/dL (ref 6.0–8.5)
eGFR: 61 mL/min/{1.73_m2} (ref >59)

## 2021-10-24 LAB — CBC WITH DIFFERENTIAL/PLATELET
Basophils Absolute: 0 10*3/uL (ref 0.0–0.2)
Basos: 1 %
EOS (ABSOLUTE): 0.1 10*3/uL (ref 0.0–0.4)
Eos: 2 %
Hematocrit: 39.3 % (ref 34.0–46.6)
Hemoglobin: 13.5 g/dL (ref 11.1–15.9)
Immature Grans (Abs): 0 10*3/uL (ref 0.0–0.1)
Immature Granulocytes: 0 %
Lymphocytes Absolute: 1.6 10*3/uL (ref 0.7–3.1)
Lymphs: 28 %
MCH: 30.5 pg (ref 26.6–33.0)
MCHC: 34.4 g/dL (ref 31.5–35.7)
MCV: 89 fL (ref 79–97)
Monocytes Absolute: 0.5 10*3/uL (ref 0.1–0.9)
Monocytes: 9 %
Neutrophils Absolute: 3.5 10*3/uL (ref 1.4–7.0)
Neutrophils: 60 %
Platelets: 368 10*3/uL (ref 150–450)
RBC: 4.43 x10E6/uL (ref 3.77–5.28)
RDW: 12.8 % (ref 11.7–15.4)
WBC: 5.7 10*3/uL (ref 3.4–10.8)

## 2021-10-24 LAB — LIPID PANEL
Chol/HDL Ratio: 2.5 ratio (ref 0.0–4.4)
Cholesterol, Total: 180 mg/dL (ref 100–199)
HDL: 71 mg/dL (ref >39)
LDL Chol Calc (NIH): 96 mg/dL (ref 0–99)
Triglycerides: 71 mg/dL (ref 0–149)
VLDL Cholesterol Cal: 13 mg/dL (ref 5–40)

## 2021-10-24 LAB — TSH+FREE T4
Free T4: 1.82 ng/dL — ABNORMAL HIGH (ref 0.82–1.77)
TSH: 0.97 u[IU]/mL (ref 0.450–4.500)

## 2021-10-25 LAB — UA/M W/RFLX CULTURE, ROUTINE

## 2021-10-28 NOTE — Progress Notes (Signed)
Please call the patient and let her know her results --Her labs are grossly normal. The main exception being her free T4 level is elevated at 1.82 and this may be a consideration for decreasing her levothyroxine dose to 125 mcg daily from 137 mcg which is what she is currently taking and then we could repeat the thyroid levels and check them in 6 weeks.  If she would rather not decrease her levothyroxine dose, I would like to repeat the thyroid levels in 6 weeks anyways to see if the level returns to normal without changing the medication.  Please let me know what she would like to do

## 2021-10-31 ENCOUNTER — Other Ambulatory Visit: Payer: Self-pay

## 2021-10-31 ENCOUNTER — Telehealth: Payer: Self-pay

## 2021-10-31 MED ORDER — LEVOTHYROXINE SODIUM 125 MCG PO TABS: 125.0000 ug | ORAL_TABLET | Freq: Every day | ORAL | 3 refills | Status: DC

## 2021-10-31 NOTE — Telephone Encounter (Signed)
-----   Message from Jonetta Osgood, NP sent at 10/28/2021 10:37 AM EDT ----- Please call the patient and let her know her results --Her labs are grossly normal. The main exception being her free T4 level is elevated at 1.82 and this may be a consideration for decreasing her levothyroxine dose to 125 mcg daily from 137 mcg which is what she is currently taking and then we could repeat the thyroid levels and check them in 6 weeks.  If she would rather not decrease her levothyroxine dose, I would like to repeat the thyroid levels in 6 weeks anyways to see if the level returns to normal without changing the medication.  Please let me know what she would like to do

## 2021-11-02 ENCOUNTER — Telehealth: Payer: Self-pay

## 2021-11-02 NOTE — Telephone Encounter (Signed)
LMOM to ask her what her specific concern is about constipation? Is it related to the dose change of her levothyroxine, her thyroid levels or not related to either?

## 2021-11-07 ENCOUNTER — Telehealth: Payer: Self-pay

## 2021-12-18 ENCOUNTER — Encounter: Payer: Self-pay | Admitting: Nurse Practitioner

## 2022-01-26 ENCOUNTER — Other Ambulatory Visit: Payer: Self-pay

## 2022-01-26 DIAGNOSIS — E039 Hypothyroidism, unspecified: Secondary | ICD-10-CM

## 2022-01-27 LAB — TSH+FREE T4
Free T4: 1.53 ng/dL (ref 0.82–1.77)
TSH: 0.91 u[IU]/mL (ref 0.450–4.500)

## 2022-02-20 ENCOUNTER — Other Ambulatory Visit: Payer: Self-pay | Admitting: Nurse Practitioner

## 2022-02-20 DIAGNOSIS — L209 Atopic dermatitis, unspecified: Secondary | ICD-10-CM

## 2022-02-20 MED ORDER — DESONIDE 0.05 % EX CREA: TOPICAL_CREAM | Freq: Two times a day (BID) | CUTANEOUS | 3 refills | Status: DC

## 2022-04-30 ENCOUNTER — Other Ambulatory Visit: Payer: Self-pay | Admitting: Nurse Practitioner

## 2022-06-08 ENCOUNTER — Telehealth: Payer: Self-pay

## 2022-06-08 ENCOUNTER — Ambulatory Visit
Admission: EM | Admit: 2022-06-08 | Discharge: 2022-06-08 | Disposition: A | Discharge status: Home / Self Care | Payer: Medicare HMO | Attending: Family Medicine | Admitting: Family Medicine

## 2022-06-08 ENCOUNTER — Encounter: Payer: Self-pay | Admitting: Emergency Medicine

## 2022-06-08 DIAGNOSIS — U071 COVID-19: Secondary | ICD-10-CM | POA: Insufficient documentation

## 2022-06-08 LAB — SARS CORONAVIRUS 2 BY RT PCR: SARS Coronavirus 2 by RT PCR: POSITIVE — AB

## 2022-06-08 NOTE — Telephone Encounter (Signed)
Pt called tat she had flu like symptoms advised her go to urgent care

## 2022-06-08 NOTE — ED Triage Notes (Signed)
Patient c/o fever, headache, bodyaches, cough, and congestion that started 3 days ago.

## 2022-06-08 NOTE — Discharge Instructions (Signed)
Your test for COVID-19 was positive, meaning that you were infected with the novel coronavirus and could give the germ to others.  Please continue isolation at home for at least 5 days since the start of your symptoms. Once you complete your 5 day quarantine, you may return to normal activities as long as you've not had a fever for over 24 hours(without taking fever reducing medicine) and your symptoms are improving. Be sure to wear a mask until Day 11.   If your were prescribed medication. Stop by the pharmacy to pick it up.   Please continue good preventive care measures, including:  frequent hand-washing, avoid touching your face, cover coughs/sneezes, stay out of crowds and keep a 6 foot distance from others.  Go to the nearest hospital emergency room if fever/cough/breathlessness are severe or illness seems like a threat to life.  You can take Tylenol and/or Ibuprofen as needed for fever reduction and pain relief.    For cough: honey 1/2 to 1 teaspoon (you can dilute the honey in water or another fluid).  You can also use guaifenesin and dextromethorphan for cough. You can use a humidifier for chest congestion and cough.  If you don't have a humidifier, you can sit in the bathroom with the hot shower running.      For sore throat: try warm salt water gargles, Mucinex sore throat cough drops or cepacol lozenges, throat spray, warm tea or water with lemon/honey, popsicles or ice, or OTC cold relief medicine for throat discomfort. You can also purchase chloraseptic spray at the pharmacy or dollar store.   For congestion: take a daily anti-histamine like Zyrtec, Claritin, and a oral decongestant, such as pseudoephedrine.  You can also use Flonase 1-2 sprays in each nostril daily. Afrin is also a good option, if you do not have high blood pressure.    It is important to stay hydrated: drink plenty of fluids (water, gatorade/powerade/pedialyte, juices, or teas) to keep your throat moisturized and help  further relieve irritation/discomfort.    Return or go to the Emergency Department if symptoms worsen or do not improve in the next few days  

## 2022-06-08 NOTE — ED Provider Notes (Signed)
[facility]    CSN: [id number] Arrival date & time: 06/08/22  1111      History   Chief Complaint Chief Complaint  Patient presents with   Fever   Headache   Generalized Body Aches    Teresa Petty is a 66 y.o. female.   Teresa   Teresa Petty presents for fever, headache, body aches that started 3 days ago.  She does not think she has COVID as she can smell.  No known sick contacts.  She has not had any shortness of breath, vomiting, diarrhea, belly pain.  Has been taking over-the-counter medications without relief.    Past Medical History:  Diagnosis Date   Anxiety    Basal cell carcinoma 05/07/2016   Left mid to low back 4cm lat to spine. Superficial.    Basal cell carcinoma 05/07/2016   Upper back spinal. Superficial with focal dermal invasion.   Basal cell carcinoma 02/21/2017   Left mid post. thigh. Superficial.    Basal cell carcinoma 08/02/2020   R nasal alar rim - scheduled for MOHS    Cancer ([place])    basel cell   Colon polyp    Depressive disorder    Heart murmur    Hx of basal cell carcinoma 1987   multiple sites    Hypertension    Hypothyroidism    Insomnia due to mental disorder(327.02)    Thyroid disease    Thyromegaly     Patient Active Problem List   Diagnosis Date Noted   Hypothyroidism 07/16/2017   Depressive disorder 07/16/2017   Cellulitis of female breast 11/16/2016   Premature ventricular contractions 06/01/2016   Chest pain with high risk for cardiac etiology 05/25/2016   Heart palpitations 05/25/2016   Left arm pain 10/30/2012   Essential hypertension 10/30/2012    Past Surgical History:  Procedure Laterality Date   APPENDECTOMY  2008   Dr Bary Castilla   BREAST BIOPSY Left 11/19/2016   FIBROCYSTIC CHANGES WITH FEATURES OF RUPTURE AND SUBSEQUENT INFLAMMATION, CONSISTENT WITH   COLONOSCOPY  March 2014   Dr Vira Agar   TONSILLECTOMY      OB History     Gravida  3   Para  3   Term      Preterm      AB       Living  3      SAB      IAB      Ectopic      Multiple      Live Births           Obstetric Comments  1st Menstrual Cycle: 11 1st Pregnancy:  28          Home Medications    Prior to Admission medications   Medication Sig Start Date End Date Taking? Authorizing Provider  amLODipine-benazepril (LOTREL) 10-40 MG capsule Take 1 capsule by mouth daily. 02/08/21  Yes [provider]  buPROPion (WELLBUTRIN XL) 300 MG 24 hr tablet Take 1 tablet (300 mg total) by mouth daily. 10/23/21  Yes Abernathy, Yetta Flock, NP  Cholecalciferol (VITAMIN D) 2000 UNITS tablet Take 2,000 Units by mouth daily.   Yes [provider]  folic acid (FOLVITE) 1 MG tablet TAKE 1 TABLET BY MOUTH DAILY 04/30/22  Yes Abernathy, Alyssa, NP  levothyroxine (SYNTHROID) 125 MCG tablet TAKE 1 TABLET BY MOUTH DAILY BEFORE BREAKFAST 02/20/22  Yes Abernathy, Alyssa, NP  magnesium oxide (MAG-OX) 400 MG tablet Take by mouth. 08/09/21  Yes [provider]  metoprolol succinate (TOPROL-XL) 25 MG 24 hr tablet Take 1 tablet by mouth daily. 08/07/21  Yes [provider]  Multiple Vitamins-Minerals (MULTIVITAMIN ADULT PO) Take by mouth.   Yes [provider]  desonide (DESOWEN) 0.05 % cream Apply topically 2 (two) times daily. 02/20/22   Jonetta Osgood, NP    Family History Family History  Problem Relation Age of Onset   Hypertension Mother    Hyperlipidemia Mother    Hypertension Father    Heart disease Father    Cancer Sister 45       appendix   Melanoma Sister 71    Social History Social History   Tobacco Use   Smoking status: Never   Smokeless tobacco: Never  Vaping Use   Vaping Use: Never used  Substance Use Topics   Alcohol use: No   Drug use: No     Allergies   Effexor [venlafaxine hcl]   Review of Systems Review of Systems: negative unless otherwise stated in Teresa.      Physical Exam Triage Vital Signs ED Triage Vitals  Enc Vitals Group     BP  06/08/22 1154 (!) 149/89     Pulse Rate 06/08/22 1154 64     Resp 06/08/22 1154 14     Temp 06/08/22 1154 98.4 F (36.9 C)     Temp Source 06/08/22 1154 Oral     SpO2 06/08/22 1154 95 %     Weight 06/08/22 1152 160 lb (72.6 kg)     Height 06/08/22 1152 5' 6.5" (1.689 m)     Head Circumference --      Peak Flow --      Pain Score 06/08/22 1151 6     Pain Loc --      Pain Edu? --      Excl. in [place]? --    No data found.  Updated Vital Signs BP (!) 149/89 (BP Location: Right Arm)   Pulse 64   Temp 98.4 F (36.9 C) (Oral)   Resp 14   Ht 5' 6.5" (1.689 m)   Wt 72.6 kg   SpO2 95%   BMI 25.44 kg/m   Visual Acuity Right Eye Distance:   Left Eye Distance:   Bilateral Distance:    Right Eye Near:   Left Eye Near:    Bilateral Near:     Physical Exam GEN:     alert, non-toxic appearing female in no distress    HENT:  mucus membranes moist, oropharyngeal without lesions or exudate, no tonsillar hypertrophy, mild oropharyngeal erythema, clear nasal discharge EYES:   pupils equal and reactive, no scleral injection or discharge NECK:  normal ROM, anterior lymphadenopathy, no meningismus   RESP:  no increased work of breathing, clear to auscultation bilaterally CVS:   regular rate and rhythm Skin:   warm and dry    UC Treatments / Results  Labs (all labs ordered are listed, but only abnormal results are displayed) Labs Reviewed  SARS CORONAVIRUS 2 BY RT PCR - Abnormal; Notable for the following components:      Result Value   SARS Coronavirus 2 by RT PCR POSITIVE (*)    All other components within normal limits    EKG   Radiology No results found.  Procedures Procedures (including critical care time)  Medications Ordered in UC Medications - No data to display  Initial Impression / Assessment and Plan / UC Course  I have reviewed the triage vital signs and  the nursing notes.  Pertinent labs & imaging results that were available during my care of the patient  were reviewed by me and considered in my medical decision making (see chart for details).       Pt is a 66 y.o. female who presents for 3 days of respiratory symptoms. Vernal is afebrile here without recent antipyretics. Satting well on room air. Overall pt is non-toxic appearing, well hydrated, without respiratory distress. COVID testing obtained and was positive. History consistent with viral respiratory illness.  Typical duration of symptoms discussed.    She has had COVID before. Discussed symptomatic treatment.  Pulmonary exam she has equal aeration bilaterally, imaging deferred.  She is outside the [facility] treatment window.  Work note offered but not needed.  Isolation and quarantine instructions provided. Typical duration of symptoms discussed. ED and return precautions and understanding voiced. Discussed MDM, treatment plan and plan for follow-up with patient/parent who agrees with plan.     Final Clinical Impressions(s) / UC Diagnoses   Final diagnoses:  [id number]     Discharge Instructions      Your test for COVID-19 was positive, meaning that you were infected with the novel coronavirus and could give the germ to others.  Please continue isolation at home for at least 5 days since the start of your symptoms. Once you complete your 5 day quarantine, you may return to normal activities as long as you've not had a fever for over 24 hours(without taking fever reducing medicine) and your symptoms are improving. Be sure to wear a mask until Day 11.   If your were prescribed medication. Stop by the pharmacy to pick it up.   Please continue good preventive care measures, including:  frequent hand-washing, avoid touching your face, cover coughs/sneezes, stay out of crowds and keep a 6 foot distance from others.  Go to the nearest hospital emergency room if fever/cough/breathlessness are severe or illness seems like a threat to life.  You can take Tylenol and/or Ibuprofen as needed for  fever reduction and pain relief.    For cough: honey 1/2 to 1 teaspoon (you can dilute the honey in water or another fluid).  You can also use guaifenesin and dextromethorphan for cough. You can use a humidifier for chest congestion and cough.  If you don't have a humidifier, you can sit in the bathroom with the hot shower running.      For sore throat: try warm salt water gargles, Mucinex sore throat cough drops or cepacol lozenges, throat spray, warm tea or water with lemon/honey, popsicles or ice, or OTC cold relief medicine for throat discomfort. You can also purchase chloraseptic spray at the pharmacy or dollar store.   For congestion: take a daily anti-histamine like Zyrtec, Claritin, and a oral decongestant, such as pseudoephedrine.  You can also use Flonase 1-2 sprays in each nostril daily. Afrin is also a good option, if you do not have high blood pressure.    It is important to stay hydrated: drink plenty of fluids (water, gatorade/powerade/pedialyte, juices, or teas) to keep your throat moisturized and help further relieve irritation/discomfort.    Return or go to the Emergency Department if symptoms worsen or do not improve in the next few days       ED Prescriptions   None    PDMP not reviewed this encounter.   Lyndee Hensen, DO 06/08/22 1314

## 2022-08-08 ENCOUNTER — Other Ambulatory Visit: Payer: Self-pay

## 2022-08-08 DIAGNOSIS — F411 Generalized anxiety disorder: Secondary | ICD-10-CM

## 2022-08-08 DIAGNOSIS — F3341 Major depressive disorder, recurrent, in partial remission: Secondary | ICD-10-CM

## 2022-08-08 MED ORDER — BUPROPION HCL ER (XL) 300 MG PO TB24: 300.0000 mg | ORAL_TABLET | Freq: Every day | ORAL | 0 refills | Status: DC

## 2022-08-22 ENCOUNTER — Other Ambulatory Visit: Payer: Self-pay

## 2022-08-22 MED ORDER — LEVOTHYROXINE SODIUM 125 MCG PO TABS: 125.0000 ug | ORAL_TABLET | Freq: Every day | ORAL | 1 refills | Status: DC

## 2022-08-30 ENCOUNTER — Ambulatory Visit
Admission: EM | Admit: 2022-08-30 | Discharge: 2022-08-30 | Disposition: A | Discharge status: Home / Self Care | Payer: Medicare HMO | Attending: Urgent Care | Admitting: Urgent Care

## 2022-08-30 DIAGNOSIS — H00011 Hordeolum externum right upper eyelid: Secondary | ICD-10-CM

## 2022-08-30 MED ORDER — ERYTHROMYCIN 5 MG/GM OP OINT: TOPICAL_OINTMENT | OPHTHALMIC | 0 refills | Status: DC

## 2022-08-30 NOTE — Discharge Instructions (Addendum)
Put a few drops of baby shampoo (e.g. Johnson and Johnson) into a cup of warm water.  Use a cotton swab or cotton ball dipped in the solution to rub on the edge of your eyelids.  Do this several times a day.

## 2022-08-30 NOTE — ED Provider Notes (Addendum)
Roderic Palau    CSN: [id number] Arrival date & time: 08/30/22  1133      History   Chief Complaint Chief Complaint  Patient presents with   Conjunctivitis    HPI Teresa Petty is a 67 y.o. female.    Conjunctivitis    Presents to urgent care with concern for possible pinkeye infection and right eye.  She endorses swelling.  Using his warm compresses with no improvement.  Also requests evaluation of her knee.  Experiencing right knee discomfort and swelling since playing "pickle ball".  Past Medical History:  Diagnosis Date   Anxiety    Basal cell carcinoma 05/07/2016   Left mid to low back 4cm lat to spine. Superficial.    Basal cell carcinoma 05/07/2016   Upper back spinal. Superficial with focal dermal invasion.   Basal cell carcinoma 02/21/2017   Left mid post. thigh. Superficial.    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)    Thyroid disease    Thyromegaly     Patient Active Problem List   Diagnosis Date Noted   Hypothyroidism 07/16/2017   Depressive disorder 07/16/2017   Cellulitis of female breast 11/16/2016   Premature ventricular contractions 06/01/2016   Chest pain with high risk for cardiac etiology 05/25/2016   Heart palpitations 05/25/2016   Left arm pain 10/30/2012   Essential hypertension 10/30/2012    Past Surgical History:  Procedure Laterality Date   APPENDECTOMY  2008   Dr Bary Castilla   BREAST BIOPSY Left 11/19/2016   FIBROCYSTIC CHANGES WITH FEATURES OF RUPTURE AND SUBSEQUENT INFLAMMATION, CONSISTENT WITH   COLONOSCOPY  March 2014   Dr Vira Agar   TONSILLECTOMY      OB History     Gravida  3   Para  3   Term      Preterm      AB      Living  3      SAB      IAB       Ectopic      Multiple      Live Births           Obstetric Comments  1st Menstrual Cycle: 11 1st Pregnancy:  28          Home Medications    Prior to Admission medications   Medication Sig Start Date End Date Taking? Authorizing Provider  amLODipine-benazepril (LOTREL) 10-40 MG capsule Take 1 capsule by mouth daily. 02/08/21   [provider]  buPROPion (WELLBUTRIN XL) 300 MG 24 hr tablet Take 1 tablet (300 mg total) by mouth daily. 08/08/22   Jonetta Osgood, NP  Cholecalciferol (VITAMIN D) 2000 UNITS tablet Take 2,000 Units by mouth daily.    [provider]  desonide (DESOWEN) 0.05 % cream Apply topically 2 (two) times daily. 02/20/22   Jonetta Osgood, NP  folic acid (FOLVITE) 1 MG tablet TAKE 1 TABLET BY MOUTH DAILY 04/30/22   Jonetta Osgood, NP  levothyroxine (SYNTHROID) 125 MCG tablet Take 1 tablet (125 mcg total) by mouth daily before breakfast. 08/22/22   Jonetta Osgood, NP  magnesium oxide (MAG-OX) 400 MG tablet Take  by mouth. 08/09/21   [provider]  metoprolol succinate (TOPROL-XL) 25 MG 24 hr tablet Take 1 tablet by mouth daily. 08/07/21   [provider]  Multiple Vitamins-Minerals (MULTIVITAMIN ADULT PO) Take by mouth.    [provider]    Family History Family History  Problem Relation Age of Onset   Hypertension Mother    Hyperlipidemia Mother    Hypertension Father    Heart disease Father    Cancer Sister 68       appendix   Melanoma Sister 70    Social History Social History   Tobacco Use   Smoking status: Never   Smokeless tobacco: Never  Vaping Use   Vaping Use: Never used  Substance Use Topics   Alcohol use: No   Drug use: No     Allergies   Effexor [venlafaxine hcl]   Review of Systems Review of Systems   Physical Exam Triage Vital Signs ED Triage Vitals  Enc Vitals Group     BP 08/30/22 1152 120/79     Pulse Rate 08/30/22 1152 (!) 59     Resp 08/30/22 1152 16      Temp 08/30/22 1152 97.8 F (36.6 C)     Temp Source 08/30/22 1152 Temporal     SpO2 08/30/22 1152 97 %     Weight --      Height --      Head Circumference --      Peak Flow --      Pain Score 08/30/22 1153 0     Pain Loc --      Pain Edu? --      Excl. in [place]? --    No data found.  Updated Vital Signs BP 120/79 (BP Location: Left Arm)   Pulse (!) 59   Temp 97.8 F (36.6 C) (Temporal)   Resp 16   SpO2 97%   Visual Acuity Right Eye Distance:   Left Eye Distance:   Bilateral Distance:    Right Eye Near:   Left Eye Near:    Bilateral Near:     Physical Exam Vitals reviewed.  Constitutional:      Appearance: Normal appearance.  Eyes:     General:        Right eye: Hordeolum present.   Musculoskeletal:     Right knee: No swelling, deformity or effusion. Normal range of motion. No tenderness. No LCL laxity, MCL laxity, ACL laxity or PCL laxity.     Instability Tests: Anterior drawer test negative. Posterior drawer test negative. Anterior Lachman test negative. Medial McMurray test negative and lateral McMurray test negative.  Neurological:     General: No focal deficit present.     Mental Status: She is alert and oriented to person, place, and time.  Psychiatric:        Mood and Affect: Mood normal.        Behavior: Behavior normal.      UC Treatments / Results  Labs (all labs ordered are listed, but only abnormal results are displayed) Labs Reviewed - No data to display  EKG   Radiology No results found.  Procedures Procedures (including critical care time)  Medications Ordered in UC Medications - No data to display  Initial Impression / Assessment and Plan / UC Course  I have reviewed the triage vital signs and the nursing notes.  Pertinent labs & imaging results that were available during my care of the patient were reviewed by me and  considered in my medical decision making (see chart for details).   Erythema and edema due to hordeolum of the  right upper lid.  Recommending use of baby shampoo and warm water to wash the margins of her eyelid, warm compresses with gentle massage.  Prescribing erythromycin ointment to use in between treatments.  Evaluation of right knee is grossly normal.  Negative draw, Lachman, McMurray.  No laxity is noted.  Recommended continued use of cold therapy as well as compression.  Encouraged patient to schedule orthopedic evaluation if her symptoms do not resolve.  Final Clinical Impressions(s) / UC Diagnoses   Final diagnoses:  None   Discharge Instructions   None    ED Prescriptions   None    PDMP not reviewed this encounter.   Rose Phi, [place] 08/30/22 1220    FitzgeraldAnnie Main, [place] 08/30/22 1222

## 2022-08-30 NOTE — ED Triage Notes (Signed)
Patient presents to UC for possible pink eye infection to right eye. C/o of eye swelling. States she has been trying warm compresses with no improvement. Also wants her knee to be evaluated. States she has had right knee discomfort and swelling since playing "pickle ball."

## 2022-10-09 ENCOUNTER — Ambulatory Visit
Admission: RE | Admit: 2022-10-09 | Discharge: 2022-10-09 | Disposition: A | Discharge status: Home / Self Care | Payer: Medicare HMO | Attending: Internal Medicine | Admitting: Internal Medicine

## 2022-10-09 ENCOUNTER — Ambulatory Visit: Payer: Medicare HMO | Admitting: Internal Medicine

## 2022-10-09 ENCOUNTER — Encounter: Payer: Self-pay | Admitting: Internal Medicine

## 2022-10-09 ENCOUNTER — Ambulatory Visit
Admission: RE | Admit: 2022-10-09 | Discharge: 2022-10-09 | Disposition: A | Discharge status: Home / Self Care | Payer: Medicare HMO | Source: Ambulatory Visit | Attending: Internal Medicine | Admitting: Internal Medicine

## 2022-10-09 DIAGNOSIS — F5102 Adjustment insomnia: Secondary | ICD-10-CM | POA: Diagnosis not present

## 2022-10-09 DIAGNOSIS — M25561 Pain in right knee: Secondary | ICD-10-CM | POA: Insufficient documentation

## 2022-10-09 DIAGNOSIS — M25511 Pain in right shoulder: Secondary | ICD-10-CM

## 2022-10-09 DIAGNOSIS — I10 Essential (primary) hypertension: Secondary | ICD-10-CM | POA: Diagnosis not present

## 2022-10-09 DIAGNOSIS — E039 Hypothyroidism, unspecified: Secondary | ICD-10-CM

## 2022-10-09 MED ORDER — CLONAZEPAM 0.5 MG PO TABS: ORAL_TABLET | ORAL | 2 refills | Status: DC

## 2022-10-09 MED ORDER — AMLODIPINE BESY-BENAZEPRIL HCL 5-20 MG PO CAPS: ORAL_CAPSULE | ORAL | 1 refills | Status: DC

## 2022-10-09 NOTE — Progress Notes (Signed)
[facility] [address] [place], [place] [address]  Internal MEDICINE  Office Visit Note  Patient Name: Teresa Petty  [id number]  [id number]  Date of Service: 10/28/2022  Chief Complaint  Patient presents with   Sleeping Problem    Trouble falling asleep and staying asleep.    HPI Patient is here for acute visit Patient has been having trouble falling asleep and staying asleep for, she has been under stress, husband with dementia and she has been having a hard time with his schedule, she has been treated in the past with anxiety and depression Blood pressure is also elevated, home readings have been normal according to her her blood pressure was actually low and her Lotrel was decreased by cardiology Patient is also complaining of shoulder and knee pain and has been having problem walking on a regular basis Patient is on Synthroid as well     Current Medication: Outpatient Encounter Medications as of 10/09/2022  Medication Sig   amLODipine-benazepril (LOTREL) 5-20 MG capsule Take one tab po qd for HTN   buPROPion (WELLBUTRIN XL) 300 MG 24 hr tablet Take 1 tablet (300 mg total) by mouth daily.   Cholecalciferol (VITAMIN D) 2000 UNITS tablet Take 2,000 Units by mouth daily.   clonazePAM (KLONOPIN) 0.5 MG tablet TAKE ONE TAB AT NIGHT FOR ANXIETY AND SLEEP   desonide (DESOWEN) 0.05 % cream Apply topically 2 (two) times daily.   erythromycin ophthalmic ointment Place a 1/2 inch ribbon of ointment into the eyelid.   folic acid (FOLVITE) 1 MG tablet TAKE 1 TABLET BY MOUTH DAILY   levothyroxine (SYNTHROID) 125 MCG tablet Take 1 tablet (125 mcg total) by mouth daily before breakfast.   magnesium oxide (MAG-OX) 400 MG tablet Take by mouth.   metoprolol succinate (TOPROL-XL) 25 MG 24 hr tablet Take 1 tablet by mouth daily.   Multiple Vitamins-Minerals (MULTIVITAMIN ADULT PO) Take by mouth.   [DISCONTINUED] amLODipine-benazepril (LOTREL) 10-40 MG capsule Take 1 capsule by  mouth daily.   No facility-administered encounter medications on file as of 10/09/2022.    Surgical History: Past Surgical History:  Procedure Laterality Date   APPENDECTOMY  2008   Dr Lemar Livings   BREAST BIOPSY Left 11/19/2016   FIBROCYSTIC CHANGES WITH FEATURES OF RUPTURE AND SUBSEQUENT INFLAMMATION, CONSISTENT WITH   COLONOSCOPY  March 2014   Dr Mechele Collin   TONSILLECTOMY      Medical History: Past Medical History:  Diagnosis Date   Anxiety    Basal cell carcinoma 05/07/2016   Left mid to low back 4cm lat to spine. Superficial.    Basal cell carcinoma 05/07/2016   Upper back spinal. Superficial with focal dermal invasion.   Basal cell carcinoma 02/21/2017   Left mid post. thigh. Superficial.    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)    Thyroid disease    Thyromegaly     Family History: Family History  Problem Relation Age of Onset   Hypertension Mother    Hyperlipidemia Mother    Hypertension Father    Heart disease Father    Cancer Sister 75       appendix  Melanoma Sister 39    Social History   Socioeconomic History   Marital status: Married    Spouse name: Not on file   Number of children: Not on file   Years of education: Not on file   Highest education level: Not on file  Occupational History   Not on file  Tobacco Use   Smoking status: Never   Smokeless tobacco: Never  Vaping Use   Vaping Use: Never used  Substance and Sexual Activity   Alcohol use: No   Drug use: No   Sexual activity: Not on file  Other Topics Concern   Not on file  Social History Narrative   Not on file   Social Determinants of Health   Financial Resource Strain: Not on file  Food Insecurity: Not on file  Transportation  Needs: Not on file  Physical Activity: Not on file  Stress: Not on file  Social Connections: Not on file  Intimate Partner Violence: Not on file      Review of Systems  Constitutional:  Negative for chills, fatigue and unexpected weight change.  HENT:  Negative for congestion, postnasal drip, rhinorrhea, sneezing and sore throat.   Eyes:  Negative for redness.  Respiratory:  Negative for cough, chest tightness and shortness of breath.   Cardiovascular:  Negative for chest pain and palpitations.  Gastrointestinal:  Negative for abdominal pain, constipation, diarrhea, nausea and vomiting.  Genitourinary:  Negative for dysuria and frequency.  Musculoskeletal:  Negative for arthralgias, back pain, joint swelling and neck pain.  Skin:  Negative for rash.  Neurological: Negative.  Negative for tremors and numbness.  Hematological:  Negative for adenopathy. Does not bruise/bleed easily.  Psychiatric/Behavioral:  Negative for behavioral problems (Depression), sleep disturbance and suicidal ideas. The patient is not nervous/anxious.     Vital Signs: BP (!) 150/90   Pulse 66   Temp 97.9 F (36.6 C)   Resp 16   Ht 5' 6.5" (1.689 m)   Wt 165 lb 6.4 oz (75 kg)   SpO2 97%   BMI 26.30 kg/m    Physical Exam Constitutional:      Appearance: Normal appearance.  HENT:     Head: Normocephalic and atraumatic.     Nose: Nose normal.     Mouth/Throat:     Mouth: Mucous membranes are moist.     Pharynx: No posterior oropharyngeal erythema.  Eyes:     Extraocular Movements: Extraocular movements intact.     Pupils: Pupils are equal, round, and reactive to light.  Cardiovascular:     Pulses: Normal pulses.     Heart sounds: Normal heart sounds.  Pulmonary:     Effort: Pulmonary effort is normal.     Breath sounds: Normal breath sounds.  Neurological:     General: No focal deficit present.     Mental Status: She is alert.  Psychiatric:        Mood and Affect: Mood normal.         Behavior: Behavior normal.        Assessment/Plan: 1. Benign hypertension Patient will continue monitor her blood pressure at home - amLODipine-benazepril (LOTREL) 5-20 MG capsule; Take one tab po qd for HTN  Dispense: 90 capsule; Refill: 1  2. Adjustment insomnia Will continue on Wellbutrin during the day and add low-dose clonazepam for insomnia, sleep hygiene was also discussed with the patient and changing the bedroom might be a good idea at this point - clonazePAM (KLONOPIN) 0.5 MG tablet; TAKE  ONE TAB AT NIGHT FOR ANXIETY AND SLEEP  Dispense: 30 tablet; Refill: 2  3. Acute pain of right knee Patient has been having struggle with walking and bending her knee we will go ahead and get x-rays today she was also instructed to try taking some nonsteroidal ibuprofen or Motrin at home - DG Knee Complete 4 Views Right; Future  4. Acute pain of right shoulder Pt might need to see ortho  - DG Shoulder Right; Future  5. Hypothyroidism, unspecified type Continue Synthroid, if needed will adjust dose  - TSH + free T4   General Counseling: Chiane verbalizes understanding of the findings of todays visit and agrees with plan of treatment. I have discussed any further diagnostic evaluation that may be needed or ordered today. We also reviewed her medications today. she has been encouraged to call the office with any questions or concerns that should arise related to todays visit.    Orders Placed This Encounter  Procedures   DG Knee Complete 4 Views Right   DG Shoulder Right   TSH + free T4    Meds ordered this encounter  Medications   amLODipine-benazepril (LOTREL) 5-20 MG capsule    Sig: Take one tab po qd for HTN    Dispense:  90 capsule    Refill:  1   clonazePAM (KLONOPIN) 0.5 MG tablet    Sig: TAKE ONE TAB AT NIGHT FOR ANXIETY AND SLEEP    Dispense:  30 tablet    Refill:  2    Total time spent:45 Minutes Time spent includes review of chart, medications, test results, and  follow up plan with the patient.   [place] Controlled Substance Database was reviewed by me.   Dr Lyndon Code Internal medicine

## 2022-10-11 LAB — TSH+FREE T4
Free T4: 1.55 ng/dL (ref 0.82–1.77)
TSH: 1.59 u[IU]/mL (ref 0.450–4.500)

## 2022-10-25 ENCOUNTER — Ambulatory Visit: Payer: Managed Care, Other (non HMO) | Admitting: Nurse Practitioner

## 2022-10-30 ENCOUNTER — Ambulatory Visit: Payer: Medicare HMO | Admitting: Internal Medicine

## 2022-10-30 ENCOUNTER — Telehealth: Payer: Self-pay | Admitting: Internal Medicine

## 2022-10-30 ENCOUNTER — Telehealth: Payer: Self-pay

## 2022-10-30 ENCOUNTER — Other Ambulatory Visit: Payer: Self-pay

## 2022-10-30 ENCOUNTER — Encounter: Payer: Self-pay | Admitting: Internal Medicine

## 2022-10-30 VITALS — BP 110/75 | HR 64 | Temp 97.9°F | Resp 16 | Ht 66.5 in | Wt 161.6 lb

## 2022-10-30 DIAGNOSIS — E039 Hypothyroidism, unspecified: Secondary | ICD-10-CM

## 2022-10-30 DIAGNOSIS — F5102 Adjustment insomnia: Secondary | ICD-10-CM | POA: Diagnosis not present

## 2022-10-30 DIAGNOSIS — I10 Essential (primary) hypertension: Secondary | ICD-10-CM

## 2022-10-30 DIAGNOSIS — M25511 Pain in right shoulder: Secondary | ICD-10-CM

## 2022-10-30 DIAGNOSIS — E782 Mixed hyperlipidemia: Secondary | ICD-10-CM

## 2022-10-30 DIAGNOSIS — M25561 Pain in right knee: Secondary | ICD-10-CM

## 2022-10-30 MED ORDER — TEMAZEPAM 15 MG PO CAPS: 15.0000 mg | ORAL_CAPSULE | Freq: Every evening | ORAL | 2 refills | Status: DC | PRN

## 2022-10-30 MED ORDER — TEMAZEPAM 7.5 MG PO CAPS: ORAL_CAPSULE | ORAL | 0 refills | Status: DC

## 2022-10-30 MED ORDER — MELOXICAM 15 MG PO TABS
15.0000 mg | ORAL_TABLET | Freq: Every day | ORAL | 0 refills | Status: DC
Start: 2022-10-30 — End: 2023-11-17

## 2022-10-30 NOTE — Telephone Encounter (Signed)
PA was done and approved for Temazepam.

## 2022-10-30 NOTE — Telephone Encounter (Signed)
Awaiting 10/30/22 office notes for Orthopedic referral-[name]

## 2022-10-30 NOTE — Progress Notes (Signed)
[facility] [address] [place], [place] [address]  Internal MEDICINE  Office Visit Note  Patient Name: Teresa Petty  [id number]  [id number]  Date of Service: 11/16/2022  Chief Complaint  Patient presents with   Follow-up   Depression   Hypertension   Quality Metric Gaps    Annual Wellness Visit    HPI Pt is here for routine follow up Insomnia is not improved with Klonopin, she will like to try something else, has been on Restoril in the past  Will like to stay on mobic until seen by ortho TSH and T4 is well controlled  Has husband with dementia at home     Current Medication: Outpatient Encounter Medications as of 10/30/2022  Medication Sig   amLODipine-benazepril (LOTREL) 5-20 MG capsule Take one tab po qd for HTN   Cholecalciferol (VITAMIN D) 2000 UNITS tablet Take 2,000 Units by mouth daily.   desonide (DESOWEN) 0.05 % cream Apply topically 2 (two) times daily.   erythromycin ophthalmic ointment Place a 1/2 inch ribbon of ointment into the eyelid.   folic acid (FOLVITE) 1 MG tablet TAKE 1 TABLET BY MOUTH DAILY   levothyroxine (SYNTHROID) 125 MCG tablet Take 1 tablet (125 mcg total) by mouth daily before breakfast.   magnesium oxide (MAG-OX) 400 MG tablet Take by mouth.   meloxicam (MOBIC) 15 MG tablet Take 1 tablet (15 mg total) by mouth daily.   metoprolol succinate (TOPROL-XL) 25 MG 24 hr tablet Take 1 tablet by mouth daily.   Multiple Vitamins-Minerals (MULTIVITAMIN ADULT PO) Take by mouth.   [DISCONTINUED] buPROPion (WELLBUTRIN XL) 300 MG 24 hr tablet Take 1 tablet (300 mg total) by mouth daily.   [DISCONTINUED] clonazePAM (KLONOPIN) 0.5 MG tablet TAKE ONE TAB AT NIGHT FOR ANXIETY AND SLEEP   [DISCONTINUED] temazepam (RESTORIL) 7.5 MG capsule Take one to tabs at night for sleep   No facility-administered encounter medications on file as of 10/30/2022.    Surgical History: Past Surgical History:  Procedure Laterality Date   APPENDECTOMY  2008    Dr Lemar Livings   BREAST BIOPSY Left 11/19/2016   FIBROCYSTIC CHANGES WITH FEATURES OF RUPTURE AND SUBSEQUENT INFLAMMATION, CONSISTENT WITH   COLONOSCOPY  March 2014   Dr Mechele Collin   TONSILLECTOMY      Medical History: Past Medical History:  Diagnosis Date   Anxiety    Basal cell carcinoma 05/07/2016   Left mid to low back 4cm lat to spine. Superficial.    Basal cell carcinoma 05/07/2016   Upper back spinal. Superficial with focal dermal invasion.   Basal cell carcinoma 02/21/2017   Left mid post. thigh. Superficial.    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)    Thyroid disease    Thyromegaly     Family History: Family History  Problem Relation Age of Onset   Hypertension Mother    Hyperlipidemia Mother    Hypertension Father    Heart disease Father    Cancer Sister 20       appendix   Melanoma Sister 17    Social History   Socioeconomic History   Marital status: Married  Spouse name: Not on file   Number of children: Not on file   Years of education: Not on file   Highest education level: Not on file  Occupational History   Not on file  Tobacco Use   Smoking status: Never   Smokeless tobacco: Never  Vaping Use   Vaping Use: Never used  Substance and Sexual Activity   Alcohol use: No   Drug use: No   Sexual activity: Not on file  Other Topics Concern   Not on file  Social History Narrative   Not on file   Social Determinants of Health   Financial Resource Strain: Not on file  Food Insecurity: Not on file  Transportation Needs: Not on file  Physical Activity: Not on file  Stress: Not on file  Social Connections: Not on file  Intimate Partner Violence: Not on file      Review of Systems   Constitutional:  Negative for chills, fatigue and unexpected weight change.  HENT:  Positive for postnasal drip. Negative for congestion, rhinorrhea, sneezing and sore throat.   Eyes:  Negative for redness.  Respiratory:  Negative for cough, chest tightness and shortness of breath.   Cardiovascular:  Negative for chest pain and palpitations.  Gastrointestinal:  Negative for abdominal pain, constipation, diarrhea, nausea and vomiting.  Genitourinary:  Negative for dysuria and frequency.  Musculoskeletal:  Negative for arthralgias, back pain, joint swelling and neck pain.  Skin:  Negative for rash.  Neurological: Negative.  Negative for tremors and numbness.  Hematological:  Negative for adenopathy. Does not bruise/bleed easily.  Psychiatric/Behavioral:  Negative for behavioral problems (Depression), sleep disturbance and suicidal ideas. The patient is not nervous/anxious.     Vital Signs: BP 110/75   Pulse 64   Temp 97.9 F (36.6 C)   Resp 16   Ht 5' 6.5" (1.689 m)   Wt 161 lb 9.6 oz (73.3 kg)   SpO2 98%   BMI 25.69 kg/m    Physical Exam Constitutional:      Appearance: Normal appearance.  HENT:     Head: Normocephalic and atraumatic.     Nose: Nose normal.     Mouth/Throat:     Mouth: Mucous membranes are moist.     Pharynx: No posterior oropharyngeal erythema.  Eyes:     Extraocular Movements: Extraocular movements intact.     Pupils: Pupils are equal, round, and reactive to light.  Cardiovascular:     Pulses: Normal pulses.     Heart sounds: Normal heart sounds.  Pulmonary:     Effort: Pulmonary effort is normal.     Breath sounds: Normal breath sounds.  Neurological:     General: No focal deficit present.     Mental Status: She is alert.  Psychiatric:        Mood and Affect: Mood normal.        Behavior: Behavior normal.        Assessment/Plan: 1. Benign hypertension Controlled at home, continue to monitor  - CBC with Differential/Platelet -  Comprehensive metabolic panel  2. Adjustment insomnia DC Klonopin, add Restoril 7.5- 15 mg qhs, will let us know how she is tolerating this   3. Hypothyroidism, unspecified type Continue same dose of synthroid  - Comprehensive metabolic panel  4. Mixed hyperlipidemia Will recheck levels  - Lipid Panel With LDL/HDL Ratio  5. Acute pain of right knee Continue with meloxicam, xray is reviewed with th pt  - meloxicam (MOBIC) 15 MG tablet; Take 1  tablet (15 mg total) by mouth daily.  Dispense: 90 tablet; Refill: 0 - Ambulatory referral to Orthopedic Surgery  6. Acute pain of right shoulder - meloxicam (MOBIC) 15 MG tablet; Take 1 tablet (15 mg total) by mouth daily.  Dispense: 90 tablet; Refill: 0 - Ambulatory referral to Orthopedic Surgery   General Counseling: teresha hanks understanding of the findings of todays visit and agrees with plan of treatment. I have discussed any further diagnostic evaluation that may be needed or ordered today. We also reviewed her medications today. she has been encouraged to call the office with any questions or concerns that should arise related to todays visit.    Orders Placed This Encounter  Procedures   CBC with Differential/Platelet   Lipid Panel With LDL/HDL Ratio   Comprehensive metabolic panel   Ambulatory referral to Orthopedic Surgery    Meds ordered this encounter  Medications   DISCONTD: temazepam (RESTORIL) 7.5 MG capsule    Sig: Take one to tabs at night for sleep    Dispense:  60 capsule    Refill:  0   meloxicam (MOBIC) 15 MG tablet    Sig: Take 1 tablet (15 mg total) by mouth daily.    Dispense:  90 tablet    Refill:  0    Total time spent:35 Minutes Time spent includes review of chart, medications, test results, and follow up plan with the patient.   [place] Controlled Substance Database was reviewed by me.   Dr Lyndon Code Internal medicine

## 2022-10-30 NOTE — Telephone Encounter (Signed)
Pt advised we send new pres to [facility]

## 2022-10-30 NOTE — Telephone Encounter (Signed)
Called in  [facility] tem[facility]epam 7.5 mg #30 with no refills as per [facility]

## 2022-10-30 NOTE — Telephone Encounter (Signed)
done

## 2022-11-01 LAB — CBC WITH DIFFERENTIAL/PLATELET
Basophils Absolute: 0 10*3/uL (ref 0.0–0.2)
Basos: 1 %
EOS (ABSOLUTE): 0.2 10*3/uL (ref 0.0–0.4)
Eos: 3 %
Hematocrit: 37.3 % (ref 34.0–46.6)
Hemoglobin: 12.4 g/dL (ref 11.1–15.9)
Immature Grans (Abs): 0 10*3/uL (ref 0.0–0.1)
Immature Granulocytes: 0 %
Lymphocytes Absolute: 1.7 10*3/uL (ref 0.7–3.1)
Lymphs: 33 %
MCH: 30.1 pg (ref 26.6–33.0)
MCHC: 33.2 g/dL (ref 31.5–35.7)
MCV: 91 fL (ref 79–97)
Monocytes Absolute: 0.4 10*3/uL (ref 0.1–0.9)
Monocytes: 8 %
Neutrophils Absolute: 2.9 10*3/uL (ref 1.4–7.0)
Neutrophils: 55 %
Platelets: 319 10*3/uL (ref 150–450)
RBC: 4.12 x10E6/uL (ref 3.77–5.28)
RDW: 12.1 % (ref 11.7–15.4)
WBC: 5.2 10*3/uL (ref 3.4–10.8)

## 2022-11-01 LAB — LIPID PANEL WITH LDL/HDL RATIO
Cholesterol, Total: 172 mg/dL (ref 100–199)
HDL: 73 mg/dL (ref >39)
LDL Chol Calc (NIH): 86 mg/dL (ref 0–99)
LDL/HDL Ratio: 1.2 ratio (ref 0.0–3.2)
Triglycerides: 68 mg/dL (ref 0–149)
VLDL Cholesterol Cal: 13 mg/dL (ref 5–40)

## 2022-11-01 LAB — COMPREHENSIVE METABOLIC PANEL
ALT: 18 IU/L (ref 0–32)
AST: 23 IU/L (ref 0–40)
Albumin/Globulin Ratio: 1.6 (ref 1.2–2.2)
Albumin: 4.1 g/dL (ref 3.9–4.9)
Alkaline Phosphatase: 77 IU/L (ref 44–121)
BUN/Creatinine Ratio: 20 (ref 12–28)
BUN: 18 mg/dL (ref 8–27)
Bilirubin Total: 0.5 mg/dL (ref 0.0–1.2)
CO2: 22 mmol/L (ref 20–29)
Calcium: 9.5 mg/dL (ref 8.7–10.3)
Chloride: 101 mmol/L (ref 96–106)
Creatinine, Ser: 0.92 mg/dL (ref 0.57–1.00)
Globulin, Total: 2.6 g/dL (ref 1.5–4.5)
Glucose: 101 mg/dL — ABNORMAL HIGH (ref 70–99)
Potassium: 4.6 mmol/L (ref 3.5–5.2)
Sodium: 135 mmol/L (ref 134–144)
Total Protein: 6.7 g/dL (ref 6.0–8.5)
eGFR: 69 mL/min/{1.73_m2} (ref >59)

## 2022-11-02 ENCOUNTER — Other Ambulatory Visit: Payer: Self-pay | Admitting: Nurse Practitioner

## 2022-11-02 DIAGNOSIS — F3341 Major depressive disorder, recurrent, in partial remission: Secondary | ICD-10-CM

## 2022-11-02 DIAGNOSIS — F411 Generalized anxiety disorder: Secondary | ICD-10-CM

## 2022-11-19 ENCOUNTER — Telehealth: Payer: Self-pay | Admitting: Internal Medicine

## 2022-11-19 NOTE — Telephone Encounter (Signed)
Orthopedic referral sent via [facility] to [name] 

## 2022-11-30 ENCOUNTER — Telehealth: Payer: Self-pay | Admitting: Internal Medicine

## 2022-11-30 NOTE — Telephone Encounter (Signed)
Orthopedic appointment 12/04/2022 @ [name]

## 2022-12-08 IMAGING — MG MM DIGITAL DIAGNOSTIC UNILAT*R* W/ TOMO W/ CAD
6 series · 6 of 18 positions shown · non-contrast
Comparison: Previous exam(s).
COMPARISON: Previous exam(s).

Addendum:
CLINICAL DATA: Patient reports visualizing and palpating small
lumps in the right axilla.

EXAM:
DIGITAL DIAGNOSTIC UNILATERAL RIGHT MAMMOGRAM WITH TOMOSYNTHESIS AND
CAD; US AXILLARY RIGHT
TECHNIQUE: Right digital diagnostic mammography and breast tomosynthesis was
performed. The images were evaluated with computer-aided detection.;
Targeted ultrasound examination of the right breast was performed.

[R MLO synth-2D (1 of 2)]
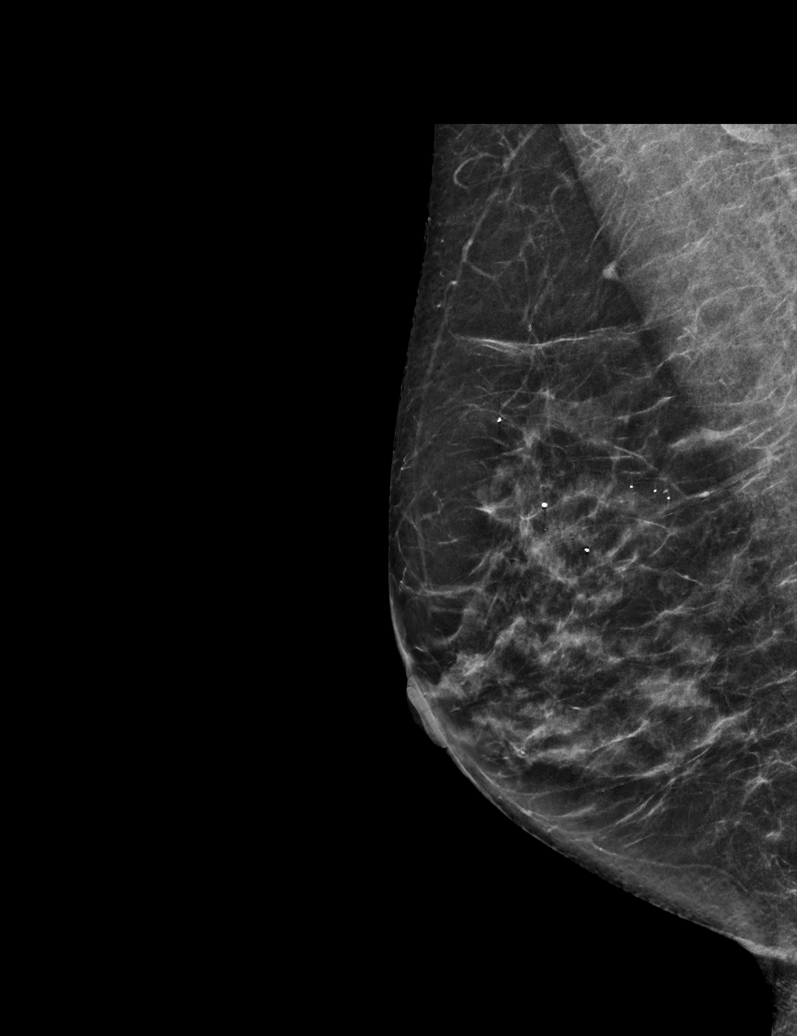

[R MLO synth-2D (2 of 2)]
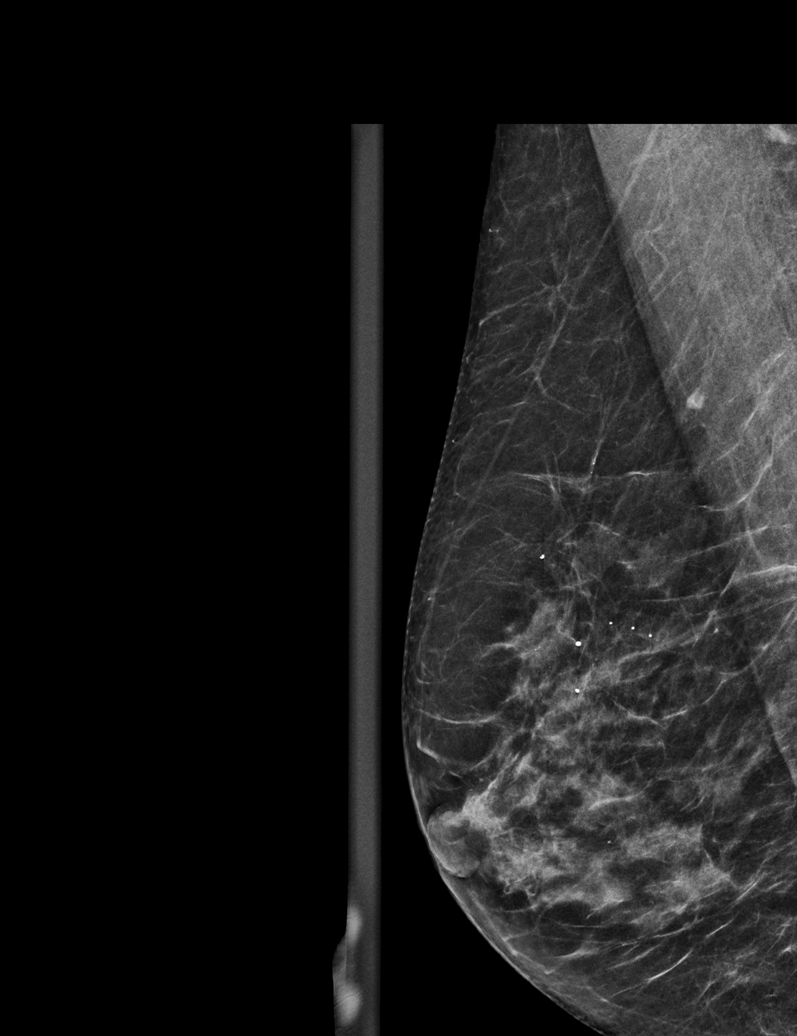

[R CC synth-2D]
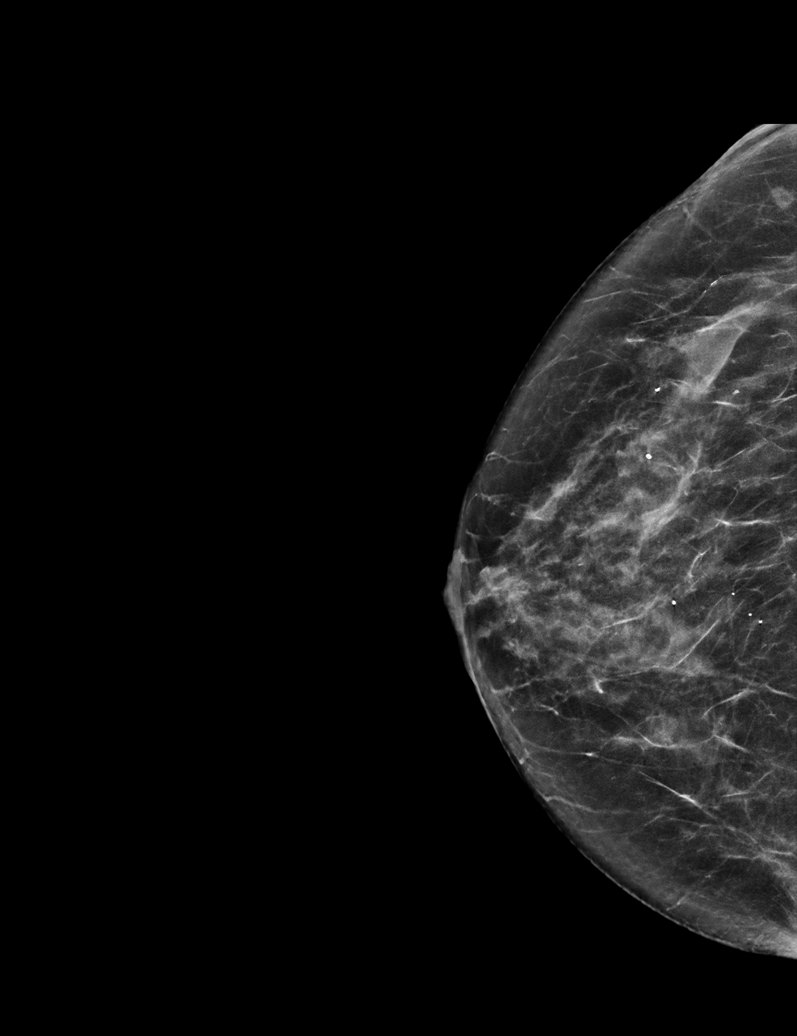

[R MLO tomo (1 of 2) · tomo slice 27/52.0]
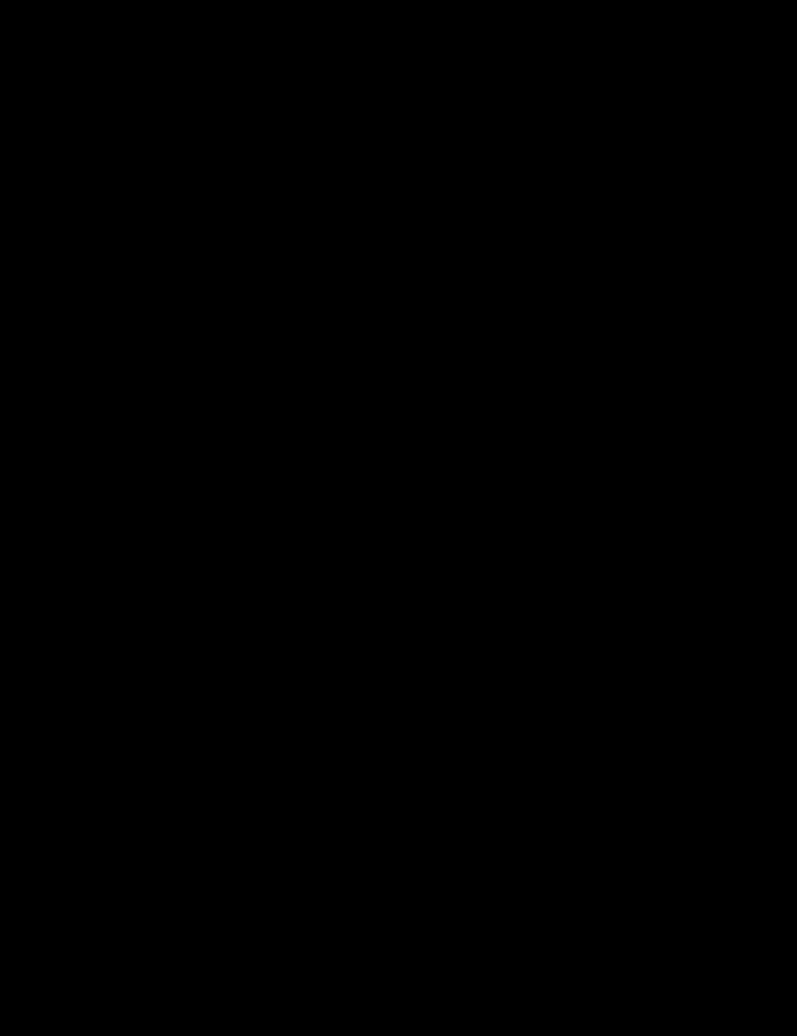

[R MLO tomo (2 of 2) · tomo slice 29/58.0]
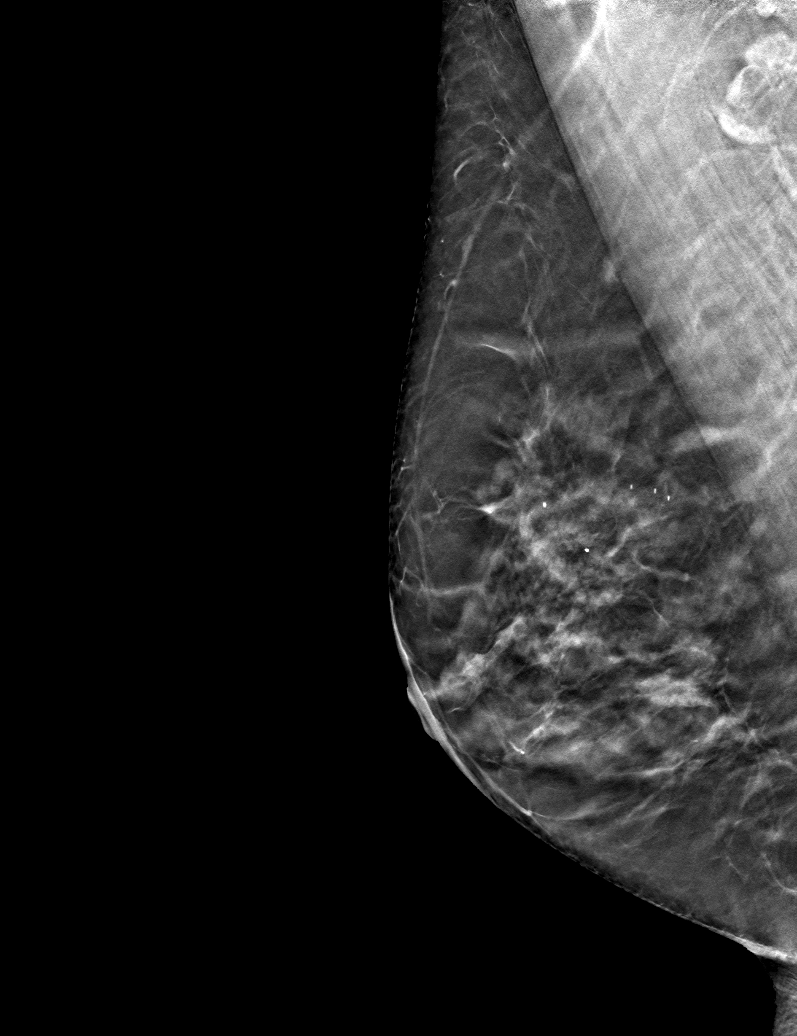

[R CC tomo · tomo slice 31/61.0]
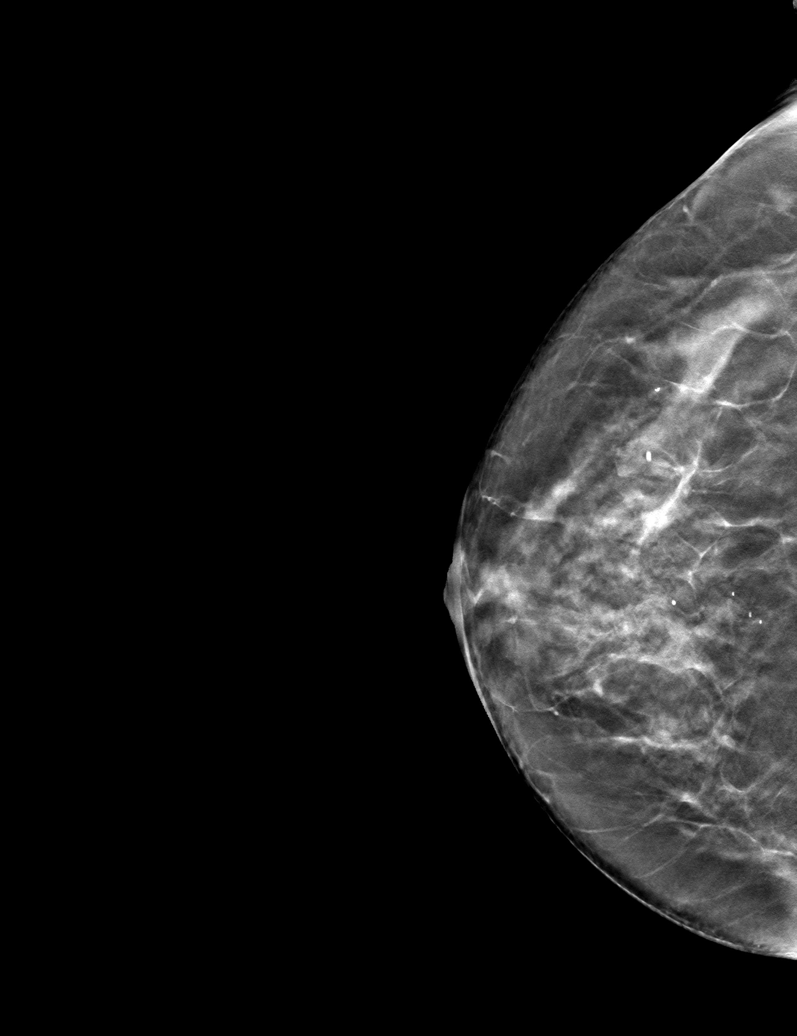

[6 of 18 positions shown; findings below may reference images not displayed]

ACR Breast Density Category c: The breast tissue is heterogeneously
dense, which may obscure small masses.
FINDINGS: There are no masses, areas of architectural distortion, areas of
significant asymmetry or suspicious calcifications. No mammographic
change.

On physical exam, there is a small nodular superficial area along
the lateral aspect of the right axilla chest superficial to the
axillary vessels.

Targeted ultrasound is performed, showing normal tissue in the right
axilla in the area of the palpable abnormality. There is no mass.
There are no normal or enlarged lymph nodes. More medially, to
normal sized, normal morphology lymph nodes are noted in the deeper
tissues.
IMPRESSION: Negative exam. No evidence of breast malignancy. No mammographic or
sonographic correlate to the reported palpable abnormality in the
lateral right axilla.

RECOMMENDATION:
1. Return to routine annual screening mammography. Last screening
mammogram performed on 09/30/2019.
[DATE]. If the palpable abnormality along the lateral right axilla
appears to be enlarging, repeat imaging, initially with ultrasound,
would be recommended.

I have discussed the findings and recommendations with the patient.
If applicable, a reminder letter will be sent to the patient
regarding the next appointment.

BI-RADS CATEGORY  1: Negative.

ADDENDUM:
Error in the technique section. The last line should read "targeted
ultrasound examination of the right axilla was performed".

*** End of Addendum ***
ACR Breast Density Category c: The breast tissue is heterogeneously
dense, which may obscure small masses.
FINDINGS: There are no masses, areas of architectural distortion, areas of
significant asymmetry or suspicious calcifications. No mammographic
change.

On physical exam, there is a small nodular superficial area along
the lateral aspect of the right axilla chest superficial to the
axillary vessels.

Targeted ultrasound is performed, showing normal tissue in the right
axilla in the area of the palpable abnormality. There is no mass.
There are no normal or enlarged lymph nodes. More medially, to
normal sized, normal morphology lymph nodes are noted in the deeper
tissues.
IMPRESSION: Negative exam. No evidence of breast malignancy. No mammographic or
sonographic correlate to the reported palpable abnormality in the
lateral right axilla.

RECOMMENDATION:
1. Return to routine annual screening mammography. Last screening
mammogram performed on 09/30/2019.
[DATE]. If the palpable abnormality along the lateral right axilla
appears to be enlarging, repeat imaging, initially with ultrasound,
would be recommended.

I have discussed the findings and recommendations with the patient.
If applicable, a reminder letter will be sent to the patient
regarding the next appointment.

BI-RADS CATEGORY  1: Negative.

## 2022-12-20 ENCOUNTER — Other Ambulatory Visit: Payer: Self-pay | Admitting: Orthopedic Surgery

## 2022-12-20 DIAGNOSIS — S83271A Complex tear of lateral meniscus, current injury, right knee, initial encounter: Secondary | ICD-10-CM

## 2022-12-24 ENCOUNTER — Encounter: Payer: Self-pay | Admitting: Orthopedic Surgery

## 2023-01-07 ENCOUNTER — Ambulatory Visit
Admission: RE | Admit: 2023-01-07 | Discharge: 2023-01-07 | Disposition: A | Discharge status: Home / Self Care | Payer: Medicare HMO | Source: Ambulatory Visit | Attending: Orthopedic Surgery | Admitting: Orthopedic Surgery

## 2023-01-07 DIAGNOSIS — S83271A Complex tear of lateral meniscus, current injury, right knee, initial encounter: Secondary | ICD-10-CM

## 2023-03-04 ENCOUNTER — Other Ambulatory Visit: Payer: Self-pay | Admitting: Nurse Practitioner

## 2023-07-02 ENCOUNTER — Ambulatory Visit (INDEPENDENT_AMBULATORY_CARE_PROVIDER_SITE_OTHER): Payer: Medicare HMO | Admitting: Internal Medicine

## 2023-07-02 ENCOUNTER — Encounter: Payer: Self-pay | Admitting: Internal Medicine

## 2023-07-02 VITALS — BP 123/81 | HR 67 | Temp 97.8°F | Resp 16 | Ht 66.5 in | Wt 167.2 lb

## 2023-07-02 DIAGNOSIS — G479 Sleep disorder, unspecified: Secondary | ICD-10-CM | POA: Diagnosis not present

## 2023-07-02 DIAGNOSIS — F3341 Major depressive disorder, recurrent, in partial remission: Secondary | ICD-10-CM

## 2023-07-02 DIAGNOSIS — I10 Essential (primary) hypertension: Secondary | ICD-10-CM

## 2023-07-02 DIAGNOSIS — G4709 Other insomnia: Secondary | ICD-10-CM | POA: Diagnosis not present

## 2023-07-02 MED ORDER — BUPROPION HCL ER (XL) 150 MG PO TB24: 150.0000 mg | ORAL_TABLET | Freq: Every day | ORAL | Status: DC

## 2023-07-02 NOTE — Progress Notes (Signed)
[facility] [address] [place], [place] [address]  Internal MEDICINE  Office Visit Note  Patient Name: Teresa Petty  [id number]  [id number]  Date of Service: 07/10/2023  Chief Complaint  Patient presents with   Follow-up   Hypertension   Depression   Quality Metric Gaps    Needs AWV    HPI Pt is seen for routine follow up She continues to have problem with sleep, difficulty falling and maintaining her sleep  Snores as well Feels tired during the day  She is on Wellbutrin for depression with some control  She is trying to lose wt.  Blood pressure medicine was recently adjusted by cariology     Current Medication: Outpatient Encounter Medications as of 07/02/2023  Medication Sig   ALPRAZolam (XANAX) 0.5 MG tablet Take 1 tablet (0.5 mg total) by mouth at bedtime as needed for anxiety.   amLODipine-benazepril (LOTREL) 10-40 MG capsule Take 1 capsule by mouth. One tab po every day.   buPROPion (WELLBUTRIN XL) 150 MG 24 hr tablet Take 1 tablet (150 mg total) by mouth daily.   Cholecalciferol (VITAMIN D) 2000 UNITS tablet Take 2,000 Units by mouth daily.   desonide (DESOWEN) 0.05 % cream Apply topically 2 (two) times daily.   levothyroxine (SYNTHROID) 125 MCG tablet TAKE 1 TABLET BY MOUTH DAILY BEFORE BREAKFAST   magnesium oxide (MAG-OX) 400 MG tablet Take by mouth.   meloxicam (MOBIC) 15 MG tablet Take 1 tablet (15 mg total) by mouth daily.   metoprolol succinate (TOPROL-XL) 25 MG 24 hr tablet Take 1 tablet by mouth daily. Patient now taking 37.5 mg   Multiple Vitamins-Minerals (MULTIVITAMIN ADULT PO) Take by mouth.   [DISCONTINUED] amLODipine-benazepril (LOTREL) 5-20 MG capsule Take one tab po qd for HTN (Patient taking differently: Take 1 capsule by mouth. Take one tab po qd for HTN. 10-40mg )   [DISCONTINUED] buPROPion (WELLBUTRIN XL) 300 MG 24 hr tablet TAKE 1 TABLET BY MOUTH DAILY   [DISCONTINUED] erythromycin ophthalmic ointment Place a 1/2 inch ribbon of  ointment into the eyelid.   [DISCONTINUED] folic acid (FOLVITE) 1 MG tablet TAKE 1 TABLET BY MOUTH DAILY   [DISCONTINUED] temazepam (RESTORIL) 15 MG capsule Take 1 capsule (15 mg total) by mouth at bedtime as needed for sleep.   No facility-administered encounter medications on file as of 07/02/2023.    Surgical History: Past Surgical History:  Procedure Laterality Date   APPENDECTOMY  2008   Dr Lemar Livings   BREAST BIOPSY Left 11/19/2016   FIBROCYSTIC CHANGES WITH FEATURES OF RUPTURE AND SUBSEQUENT INFLAMMATION, CONSISTENT WITH   COLONOSCOPY  March 2014   Dr Mechele Collin   TONSILLECTOMY      Medical History: Past Medical History:  Diagnosis Date   Anxiety    Basal cell carcinoma 05/07/2016   Left mid to low back 4cm lat to spine. Superficial.    Basal cell carcinoma 05/07/2016   Upper back spinal. Superficial with focal dermal invasion.   Basal cell carcinoma 02/21/2017   Left mid post. thigh. Superficial.    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)    Thyroid disease    Thyromegaly  Family History: Family History  Problem Relation Age of Onset   Hypertension Mother    Hyperlipidemia Mother    Hypertension Father    Heart disease Father    Cancer Sister 50       appendix   Melanoma Sister 71    Social History   Socioeconomic History   Marital status: Married    Spouse name: Not on file   Number of children: Not on file   Years of education: Not on file   Highest education level: Not on file  Occupational History   Not on file  Tobacco Use   Smoking status: Never   Smokeless tobacco: Never  Vaping Use   Vaping status: Never Used  Substance and Sexual Activity   Alcohol use: No   Drug use: No   Sexual activity:  Not on file  Other Topics Concern   Not on file  Social History Narrative   Not on file   Social Drivers of Health   Financial Resource Strain: Not on file  Food Insecurity: Not on file  Transportation Needs: Not on file  Physical Activity: Not on file  Stress: Not on file  Social Connections: Not on file  Intimate Partner Violence: Not on file      Review of Systems  Constitutional:  Negative for fatigue and fever.  HENT:  Negative for congestion, mouth sores and postnasal drip.   Respiratory:  Negative for cough.   Cardiovascular:  Negative for chest pain.  Genitourinary:  Negative for flank pain.  Psychiatric/Behavioral:  Positive for dysphoric mood and sleep disturbance. The patient is nervous/anxious.     Vital Signs: BP 123/81   Pulse 67   Temp 97.8 F (36.6 C)   Resp 16   Ht 5' 6.5" (1.689 m)   Wt 167 lb 3.2 oz (75.8 kg)   SpO2 97%   BMI 26.58 kg/m    Physical Exam Constitutional:      Appearance: Normal appearance.  HENT:     Head: Normocephalic and atraumatic.     Nose: Nose normal.     Mouth/Throat:     Mouth: Mucous membranes are moist.     Pharynx: No posterior oropharyngeal erythema.  Eyes:     Extraocular Movements: Extraocular movements intact.     Pupils: Pupils are equal, round, and reactive to light.  Cardiovascular:     Pulses: Normal pulses.     Heart sounds: Normal heart sounds.  Pulmonary:     Effort: Pulmonary effort is normal.     Breath sounds: Normal breath sounds.  Neurological:     General: No focal deficit present.     Mental Status: She is alert.  Psychiatric:        Mood and Affect: Mood normal.        Behavior: Behavior normal.        Assessment/Plan: 1. Sleep disturbance (Primary) Pt has multiple symptoms of sleep apnea, including difficulty falling a sleep, frequent awakening, restlessness and snoring, will need further evaluation  - [facility] SLEEP STUDY; Future  2. Other insomnia Will add low dose alprazolam as  cannot take ambien - [facility] SLEEP STUDY; Future  3. Essential hypertension Controlled, will continue on Lotrel    4. Recurrent major depressive disorder, in partial remission (HCC) Continue on Wellbutrin    General Counseling: jessie cowher understanding of the findings of todays visit and agrees with plan of treatment. I have discussed any further diagnostic evaluation that may be needed or  ordered today. We also reviewed her medications today. she has been encouraged to call the office with any questions or concerns that should arise related to todays visit.    Orders Placed This Encounter  Procedures   [facility] SLEEP STUDY    Meds ordered this encounter  Medications   buPROPion (WELLBUTRIN XL) 150 MG 24 hr tablet    Sig: Take 1 tablet (150 mg total) by mouth daily.   ALPRAZolam (XANAX) 0.5 MG tablet    Sig: Take 1 tablet (0.5 mg total) by mouth at bedtime as needed for anxiety.    Dispense:  30 tablet    Refill:  3    Total time spent:35 Minutes Time spent includes review of chart, medications, test results, and follow up plan with the patient.   [place] Controlled Substance Database was reviewed by me.   Dr Lyndon Code Internal medicine

## 2023-07-03 MED ORDER — ALPRAZOLAM 0.5 MG PO TABS: 0.5000 mg | ORAL_TABLET | Freq: Every evening | ORAL | 3 refills | Status: DC | PRN

## 2023-07-09 ENCOUNTER — Telehealth: Payer: Self-pay | Admitting: Internal Medicine

## 2023-07-09 NOTE — Telephone Encounter (Signed)
Awaiting 07/02/23 office notes for SS order-[name]

## 2023-07-11 ENCOUNTER — Telehealth: Payer: Self-pay | Admitting: Internal Medicine

## 2023-07-11 NOTE — Telephone Encounter (Signed)
SS order faxed to [facility]; [phone]

## 2023-07-15 ENCOUNTER — Telehealth: Payer: Self-pay | Admitting: Nurse Practitioner

## 2023-07-15 NOTE — Telephone Encounter (Signed)
SS appointment 07/28/23 @ [name]

## 2023-07-23 ENCOUNTER — Ambulatory Visit: Payer: Managed Care, Other (non HMO) | Admitting: Internal Medicine

## 2023-07-28 ENCOUNTER — Encounter (INDEPENDENT_AMBULATORY_CARE_PROVIDER_SITE_OTHER): Payer: Self-pay | Admitting: Internal Medicine

## 2023-07-28 DIAGNOSIS — G4733 Obstructive sleep apnea (adult) (pediatric): Secondary | ICD-10-CM

## 2023-08-05 ENCOUNTER — Telehealth: Payer: Self-pay | Admitting: Internal Medicine

## 2023-08-05 NOTE — Telephone Encounter (Signed)
 Teresa Petty (July 05, 1955) is scheduled for her CPAP titration on Feb. 27, 2025 Per Denny Peon at [facility]

## 2023-08-06 NOTE — Procedures (Signed)
 [facility]  Polysomnogram Report Part I                                                               Phone: [phone] Fax: [phone]  Patient Name: Teresa Petty, Teresa Petty. Acquisition Number: [id number]  Date of Birth: 1955/11/20 Acquisition Date: 07/28/2023  Referring Physician: Arlyss Repress R. Abernathy, FNP     History: The patient is a 68 year old  who was referred for evaluation of . Medical History: depression, hypertension.  Medications: alprazolam, amlodipine-benzaepril, bupropion, cholecalciferol, desonide, levothyroxine, magnesium oxide, meloxlcam, metoprolol, multiple vitamins, erythromycin ophthalmio, folic acid, temazepam.  Procedure: This routine overnight polysomnogram was performed on the [facility] 5 using the standard diagnostic protocol. This included 6 channels of EEG, 2 channels of EOG, chin EMG, bilateral anterior tibialis EMG, nasal/oral thermistor, PTAF (nasal pressure transducer), chest and abdominal wall movements, EKG, and pulse oximetry.  Description: The total recording time was 486.6 minutes. The total sleep time was 412.0 minutes. There were a total of 70.5 minutes of wakefulness after sleep onset for a reducedsleep efficiency of 84.7%. The latency to sleep onset was short at 4.1 minutes. The R sleep onset latency was within normal limits at 93.5 minutes. Sleep parameters, as a percentage of the total sleep time, demonstrated 7.6% of sleep was in N1 sleep, 66.0% N2, 6.2% N3 and 20.1% R sleep. There were a total of 152 arousals for an arousal index of 22.1 arousals per hour of sleep that was elevated.  Respiratory monitoring demonstrated   snoring . There were 143 apneas and hypopneas for an Apnea Hypopnea Index of 20.8 apneas and hypopneas per hour of sleep. The REM related apnea hypopnea index was 7.2/hr of REM sleep compared to a NREM AHI of 24.3/hr.  The average duration of the respiratory events was 28.8 seconds with a maximum duration of 66.0 seconds. The  respiratory events occurred exclusively in the supine position with an AHI of 30.9. The respiratory events were associated with peripheral oxygen desaturations on the average to 91%. The lowest oxygen desaturation associated with a respiratory event was 86%. Additionally, the baseline oxygen saturation during wakefulness was 76%, during NREM sleep averaged 95%, and during REM sleep averaged  95%. The total duration of oxygen < 90% was 17.5 minutes and <80% was 14.0 minutes.  Cardiac monitoring-  demonstrate transient cardiac decelerations associated with the apneas.  significant cardiac rhythm irregularities.   Periodic limb movement monitoring- did not demonstrate periodic limb movements.     Impression: This routine overnight polysomnogram demonstrated significant, position-dependent obstructive sleep apnea with an overall Apnea Hypopnea Index of 20.8 apneas and hypopneas per hour of sleep. The respiratory events occurred exclusively in the supine position with an AHI of 30.9. The lowest desaturation was to 86%.    reduced sleep efficiency with anelevated arousal index and a reduced percentage of slow wave sleep. These findings would appear to be due to the obstructive sleep apnea.  Recommendations:  A CPAP titration would be recommended due to the severity of the sleep apnea. Some supine sleep should be ensure to optimize the titration. Additionally, would recommend weight loss in a patient with a BMI of 26.6 lb/in2    Yevonne Pax, MD, [facility] Diplomate ABMS-Pulmonary, Critical Care and Sleep Medicine  Electronically reviewed and digitally signed   [facility] Polysomnogram Report Part II  Phone: [phone] Fax: [phone]  Patient last name Dancer Neck Size 14.5 in. Acquisition [id number]  Patient first name Teresa Petty. Weight 165.0 lbs. Started 07/28/2023 at 9:27:07 PM  Birth date 02-05-1956 Height 66.0 in. Stopped 07/29/2023 at 5:43:55 AM  Age 68 BMI 26.6 lb/in2  Duration 486.6  Study Type Adult      Candelaria Stagers, RPSGT // [facility] Fillers  Reviewed by: Valentino Hue. Henke, PhD, ABSM, FAASM Sleep Data: Lights Out: 9:34:01 PM Sleep Onset: 9:38:07 PM  Lights On: 5:40:37 AM Sleep Efficiency: 84.7 %  Total Recording Time: 486.6 min Sleep Latency (from Lights Off) 4.1 min  Total Sleep Time (TST): 412.0 min R Latency (from Sleep Onset): 93.5 min  Sleep Period Time: 454.5 min Total number of awakenings: 12  Wake during sleep: 42.5 min Wake After Sleep Onset (WASO): 70.5 min   Sleep Data:         Arousal Summary: Stage  Latency from lights out (min) Latency from sleep onset (min) Duration (min) % Total Sleep Time  Normal values  N 1 4.1 0.0 31.5 7.6 (5%)  N 2 5.1 1.0 272.0 66.0 (50%)  N 3 20.6 16.5 25.5 6.2 (20%)  R 97.6 93.5 83.0 20.1 (25%)   Number Index  Spontaneous 88 12.8  Apneas & Hypopneas 67 9.8  RERAs 0 0.0       (Apneas & Hypopneas & RERAs)  (67) (9.8)  Limb Movement 0 0.0  Snore 0 0.0  TOTAL 155 22.6     Respiratory Data:  CA OA MA Apnea Hypopnea* A+ H RERA Total  Number 0 50 0 50 93 143 0 143  Mean Dur (sec) 0.0 22.1 0.0 22.1 32.4 28.8 0.0 28.8  Max Dur (sec) 0.0 42.5 0.0 42.5 66.0 66.0 0.0 66.0  Total Dur (min) 0.0 18.4 0.0 18.4 50.2 68.6 0.0 68.6  % of TST 0.0 4.5 0.0 4.5 12.2 16.6 0.0 16.6  Index (#/h TST) 0.0 7.3 0.0 7.3 13.5 20.8 0.0 20.8  *Hypopneas scored based on 4% or greater desaturation.  Sleep Stage:        REM NREM TST  AHI 7.2 24.3 20.8  RDI 7.2 24.3 20.8           Body Position Data:  Sleep (min) TST (%) REM (min) NREM (min) CA (#) OA (#) MA (#) HYP (#) AHI (#/h) RERA (#) RDI (#/h) Desat (#)  Supine 277.5 67.35 40.5 237.0 0 50 0 93 30.9 0 30.9 117  Non-Supine 134.50 32.65 42.50 92.00 0.00 0.00 0.00 0.00 0.00 0 0.00 1.00  Left: 78.0 18.93 14.5 63.5 0 0 0 0 0.0 0 0.00 0  Right: 56.5 13.71 28.0 28.5 0 0 0 0 0.0 0 0.00 1  UP: 0.0 0.00 0.0 0.0 0 0 0 0 0.0 0 0.00 0     Snoring: Total number of  snoring episodes  0  Total time with snoring    min (   % of sleep)   Oximetry Distribution:             WK REM NREM TOTAL  Average (%)   76 95 95 92  < 90% 14.0 0.1 3.4 17.5  < 80% 14.0 0.0 0.0 14.0  < 70% 14.0 0.0 0.0 14.0  # of Desaturations* 1 11 106 118  Desat Index (#/hour) 0.9 8.0 19.3 17.2  Desat Max (%) 3 6 11  11  Desat Max Dur (sec) 22.0 69.0 106.0 106.0  Approx Min O2 during sleep 86  Approx min O2 during a respiratory event 86  Was Oxygen added (Y/N) and final rate :    LPM  *Desaturations based on 3% or greater drop from baseline.   Cheyne Stokes Breathing: None Present   Heart Rate Summary:  Average Heart Rate During Sleep 47.1 bpm      Highest Heart Rate During Sleep (95th %) 54.0 bpm      Highest Heart Rate During Sleep 76 bpm      Highest Heart Rate During Recording (TIB) 128 bpm       Heart Rate Observations: Event Type # Events   Bradycardia 0 Lowest HR Scored: N/A  Sinus Tachycardia During Sleep 0 Highest HR Scored: N/A  Narrow Complex Tachycardia 0 Highest HR Scored: N/A  Wide Complex Tachycardia 0 Highest HR Scored: N/A  Asystole 0 Longest Pause: N/A  Atrial Fibrillation 0 Duration Longest Event: N/A  Other Arrythmias   Type:    Periodic Limb Movement Data: (Primary legs unless otherwise noted) Total # Limb Movement 0 Limb Movement Index 0.0  Total # PLMS    PLMS Index     Total # PLMS Arousals    PLMS Arousal Index     Percentage Sleep Time with PLMS   min (   % sleep)  Mean Duration limb movements (secs)

## 2023-08-13 ENCOUNTER — Ambulatory Visit: Payer: Managed Care, Other (non HMO) | Admitting: Internal Medicine

## 2023-08-15 ENCOUNTER — Encounter (INDEPENDENT_AMBULATORY_CARE_PROVIDER_SITE_OTHER): Payer: Self-pay | Admitting: Internal Medicine

## 2023-08-15 DIAGNOSIS — G4733 Obstructive sleep apnea (adult) (pediatric): Secondary | ICD-10-CM

## 2023-08-20 NOTE — Procedures (Signed)
 [facility]  Polysomnogram Report Part I  Phone: [phone] Fax: [phone]  Patient Name: Teresa Petty, Teresa Petty. Acquisition Number: [id number]  Date of Birth: May 12, 1956 Acquisition Date: 08/15/2023  Referring Physician: Sallyanne Kuster FNP-C     History: The patient is a 68 year old  . Medical History: Depression, hypertension.  Medications: Alprazolam, amlodipine-benzaepril, bupropion, cholecalciferol, desonide, levothyroxine, magnesium oxide, meloxlcam, metoprolol, multiple vitamins, erythromycin ophthalmio, folic acid, temazepam.  Procedure: This routine overnight polysomnogram was performed on the [facility] 5 using the standard CPAP protocol. This included 6 channels of EEG, 2 channels of EOG, chin EMG, bilateral anterior tibialis EMG, nasal/oral thermistor, PTAF (nasal pressure transducer), chest and abdominal wall movements, EKG, and pulse oximetry.  Description: The total recording time was 371.5 minutes. The total sleep time was 272.0 minutes. There were a total of 97.5 minutes of wakefulness after sleep onset for a reducedsleep efficiency of 73.2%. The latency to sleep onset was shortat 2.0 minutes. The R sleep onset latency wasprolonged at 264.5 minutes. Sleep parameters, as a percentage of the total sleep time, demonstrated 11.4% of sleep was in N1 sleep, 77.9% N2, 0.0% N3 and 10.7% R sleep. There were a total of 36 arousals for an arousal index of 7.9 arousals per hour of sleep that was normal.  Overall, there were a total of 21 respiratory events for a respiratory disturbance index, which includes apneas, hypopneas and RERAs (increased respiratory effort) of 4.6 respiratory events per hour of sleep during the pressure titration.  was initiated at 4 cm H2O at lights out, 11:21 p.m. It was titrated in 1-2 cm increments to 11 cm H2O. The apnea was well controlled at this pressure although RERAs were observed. The pressure was further titrated to the final pressure of 13 cm H2O.    Additionally, the baseline oxygen saturation during wakefulness was 97%, during NREM sleep averaged 95%, and during REM sleep averaged 95%. The total duration of oxygen < 90% was 1.4 minutes.  Cardiac monitoring-  significant cardiac rhythm irregularities.   Periodic limb movement monitoring- did not demonstrate periodic limb movements.   Impression: This patient's obstructive sleep apnea demonstrated significant improvement with the utilization of nasal  at 13 cm H2O.      Recommendations: Would recommend utilization of nasal  at 13 cm H2O.     An AirFit F20 mask, size Medium , was used. Chin strap used during study- No . Humidifier used during study- Yes .     Yevonne Pax, MD, [facility] Diplomate ABMS-Pulmonary, Critical Care and Sleep Medicine  Electronically reviewed and digitally signed  [facility] CPAP/BIPAP Polysomnogram Report Part II Phone: [phone] Fax: [phone]  Patient last name Tiley Neck Size    in. Acquisition [id number]  Patient first name Teresa Petty. Weight 167.0 lbs. Started 08/15/2023 at 11:13:23 PM  Birth date [birth year] Height 66.0 in. Stopped 08/16/2023 at 5:36:53 AM  Age 68      Type Adult BMI 27.0 lb/in2 Duration 371.5  Robbi Garter. RPSGT.   Reviewed by: Valentino Hue Henke, PhD, ABSM, FAASM Sleep Data: Lights Out: 11:21:23 PM Sleep Onset: 11:23:23 PM  Lights On: 5:32:53 AM Sleep Efficiency: 73.2 %  Total Recording Time: 371.5 min Sleep Latency (from Lights Off) 2.0 min  Total Sleep Time (TST): 272.0 min R Latency (from Sleep Onset): 264.5 min  Sleep Period Time: 354.0 min Total number of awakenings: 19  Wake during sleep: 82.0 min Wake After Sleep Onset (WASO): 97.5 min   Sleep  Data:         Arousal Summary: Stage  Latency from lights out (min) Latency from sleep onset (min) Duration (min) % Total Sleep Time  Normal values  N 1 2.0 0.0 31.0 11.4 (5%)  N 2 3.0 1.0 212.0 77.9 (50%)  N 3       0.0 0.0 (20%)  R 266.5 264.5 29.0 10.7  (25%)   Number Index  Spontaneous 30 6.6  Apneas & Hypopneas 3 0.7  RERAs 7 1.5       (Apneas & Hypopneas & RERAs)  (10) (2.2)  Limb Movement 0 0.0  Snore 0 0.0  TOTAL 40 8.8     Respiratory Data:  CA OA MA Apnea Hypopnea* A+ H RERA Total  Number 1 10 0 11 6 17 7 24   Mean Dur (sec) 25.5 24.6 0.0 24.7 17.8 22.3 10.0 18.7  Max Dur (sec) 25.5 36.5 0.0 36.5 20.5 36.5 10.0 36.5  Total Dur (min) 0.4 4.1 0.0 4.5 1.8 6.3 1.2 7.5  % of TST 0.2 1.5 0.0 1.7 0.7 2.3 0.4 2.7  Index (#/h TST) 0.2 2.2 0.0 2.4 1.3 3.8 1.5 5.3  *Hypopneas scored based on 4% or greater desaturation.  Sleep Stage:         REM NREM TST  AHI 16.6 2.2 3.8  RDI 16.6 4.0 5.3   Sleep (min) TST (%) REM (min) NREM (min) CA (#) OA (#) MA (#) HYP (#) AHI (#/h) RERA (#) RDI (#/h) Desat (#)  Supine 160.5 59.01 29.0 131.5 1 10  0 6 6.4 7 9.0 12  Non-Supine 111.50 40.99 0.00 111.50 0.00 0.00 0.00 0.00 0.00 0 0.00 0.00  Left: 56.0 20.59 0.0 56.0 0 0 0 0 0.0 0 0.00 0  Right: 55.5 20.40 0.0 55.5 0 0 0 0 0.0 0 0.00 0  UP: 0.0 0.00 0.0 0.0 0 0 0 0 0.0 0 0.00 0     Snoring: Total number of snoring episodes  0  Total time with snoring    min (   % of sleep)   Oximetry Distribution:             WK REM NREM TOTAL  Average (%)   97 95 95 96  < 90% 0.1 1.3 0.0 1.4  < 80% 0.0 0.0 0.0 0.0  < 70% 0.0 0.0 0.0 0.0  # of Desaturations* 0 7 5 12   Desat Index (#/hour) 0.0 14.5 1.2 2.6  Desat Max (%) 0 11 7 11   Desat Max Dur (sec) 0.0 96.0 88.0 96.0  Approx Min O2 during sleep 86  Approx min O2 during a respiratory event 86  Was Oxygen added (Y/N) and final rate :    LPM  *Desaturations based on 4% or greater drop from baseline.   Cheyne Stokes Breathing: None Present   Heart Rate Summary:  Average Heart Rate During Sleep 53.7 bpm      Highest Heart Rate During Sleep (95th %) 59.0 bpm      Highest Heart Rate During Sleep 192 bpm (artifact)  Highest Heart Rate During Recording (TIB) 255 bpm (artifact)   Heart  Rate Observations: Event Type # Events   Bradycardia 0 Lowest HR Scored: N/A  Sinus Tachycardia During Sleep 0 Highest HR Scored: N/A  Narrow Complex Tachycardia 0 Highest HR Scored: N/A  Wide Complex Tachycardia 0 Highest HR Scored: N/A  Asystole 0 Longest Pause: N/A  Atrial Fibrillation 0 Duration Longest Event: N/A  Other Arrythmias   Type:  Periodic Limb Movement Data: (Primary legs unless otherwise noted) Total # Limb Movement 0 Limb Movement Index 0.0  Total # PLMS    PLMS Index     Total # PLMS Arousals    PLMS Arousal Index     Percentage Sleep Time with PLMS   min (   % sleep)  Mean Duration limb movements (secs)       IPAP Level (cmH2O) EPAP Level (cmH2O) Total Duration (min) Sleep Duration (min) Sleep (%) REM (%) CA  #) OA # MA # HYP #) AHI (#/hr) RERAs # RERAs (#/hr) RDI (#/hr)  0 0 3.2 0.0 0.0 0.0 0 0 0 0 0.0 0 0.0 0.0  4 4 206.8 170.2 82.3 0.0 0 1 0 2 1.1 0 0.0 1.1  5 5  53.3 35.9 67.4 0.4 0 4 0 0 6.7 0 0.0 6.7  7 7  6.6 6.6 100.0 100.0 0 2 0 2 36.4 0 0.0 36.4  9 9  26.8 25.3 94.4 80.6 0 2 0 2 9.5 0 0.0 9.5  11 11  19.3 18.8 97.4 0.0 1 1 0 0 6.4 7 22.3 28.7  13 13  29.4 13.3 45.2 0.0 0 0 0 0 0.0 0 0.0 0.0

## 2023-09-10 ENCOUNTER — Ambulatory Visit: Admitting: Internal Medicine

## 2023-09-16 ENCOUNTER — Other Ambulatory Visit: Payer: Self-pay | Admitting: Internal Medicine

## 2023-11-05 ENCOUNTER — Ambulatory Visit: Admitting: Internal Medicine

## 2023-11-17 ENCOUNTER — Encounter: Payer: Self-pay | Admitting: Emergency Medicine

## 2023-11-17 ENCOUNTER — Ambulatory Visit
Admission: EM | Admit: 2023-11-17 | Discharge: 2023-11-17 | Disposition: A | Discharge status: Home / Self Care | Attending: Emergency Medicine | Admitting: Emergency Medicine

## 2023-11-17 DIAGNOSIS — E871 Hypo-osmolality and hyponatremia: Secondary | ICD-10-CM | POA: Diagnosis present

## 2023-11-17 DIAGNOSIS — U071 COVID-19: Secondary | ICD-10-CM | POA: Insufficient documentation

## 2023-11-17 LAB — BASIC METABOLIC PANEL WITH GFR
Anion gap: 7 (ref 5–15)
BUN: 9 mg/dL (ref 8–23)
CO2: 24 mmol/L (ref 22–32)
Calcium: 9 mg/dL (ref 8.9–10.3)
Chloride: 97 mmol/L — ABNORMAL LOW (ref 98–111)
Creatinine, Ser: 0.85 mg/dL (ref 0.44–1.00)
GFR, Estimated: >60 mL/min (ref >60)
Glucose, Bld: 108 mg/dL — ABNORMAL HIGH (ref 70–99)
Potassium: 4.2 mmol/L (ref 3.5–5.1)
Sodium: 128 mmol/L — ABNORMAL LOW (ref 135–145)

## 2023-11-17 LAB — RESP PANEL BY RT-PCR (RSV, FLU A&B, COVID)  RVPGX2
Influenza A by PCR: NEGATIVE
Influenza B by PCR: NEGATIVE
Resp Syncytial Virus by PCR: NEGATIVE
SARS Coronavirus 2 by RT PCR: POSITIVE — AB

## 2023-11-17 MED ORDER — IBUPROFEN 600 MG PO TABS: 600.0000 mg | ORAL_TABLET | Freq: Three times a day (TID) | ORAL | 0 refills | Status: DC | PRN

## 2023-11-17 MED ORDER — NIRMATRELVIR/RITONAVIR (PAXLOVID)TABLET: 3.0000 | ORAL_TABLET | Freq: Two times a day (BID) | ORAL | 0 refills | Status: AC

## 2023-11-17 MED ORDER — PROMETHAZINE-DM 6.25-15 MG/5ML PO SYRP: 5.0000 mL | ORAL_SOLUTION | Freq: Four times a day (QID) | ORAL | 0 refills | Status: DC | PRN

## 2023-11-17 MED ORDER — IPRATROPIUM BROMIDE 0.06 % NA SOLN
2.0000 | Freq: Four times a day (QID) | NASAL | 0 refills | Status: AC
Start: 2023-11-17 — End: ?

## 2023-11-17 NOTE — ED Provider Notes (Signed)
 HPI  SUBJECTIVE:  Teresa Petty is a 68 y.o. female who presents with headache, nasal congestion, excessive rhinorrhea, fevers Tmax one 1.6, body aches, scratchy throat, postnasal drip, coughing that is occasionally productive of mucus.  No wheezing, chest pain, shortness of breath, nausea, vomiting or diarrhea, abdominal pain.  No known COVID exposure, but she just got back from [place] 5 days ago.  She did not get any of the COVID vaccines.  She has been taking Tylenol and ibuprofen with improvement in her symptoms, last dose of Tylenol was within 6 hours of evaluation.  Symptoms are worse with being up and when the medications wear off. She has a past medical history of hypertension, hypothyroidism, depression.  No history of chronic kidney disease.  Past Medical History:  Diagnosis Date   Anxiety    Basal cell carcinoma 05/07/2016   Left mid to low back 4cm lat to spine. Superficial.    Basal cell carcinoma 05/07/2016   Upper back spinal. Superficial with focal dermal invasion.   Basal cell carcinoma 02/21/2017   Left mid post. thigh. Superficial.    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)    Thyroid  disease    Thyromegaly     Past Surgical History:  Procedure Laterality Date   APPENDECTOMY  2008   Dr Marquita Situ   BREAST BIOPSY Left 11/19/2016   FIBROCYSTIC CHANGES WITH FEATURES OF RUPTURE AND SUBSEQUENT INFLAMMATION, CONSISTENT WITH   COLONOSCOPY  08/2012   Dr Felicita Horns   MOHS SURGERY     chin   TONSILLECTOMY      Family History  Problem Relation Age of Onset   Hypertension Mother    Hyperlipidemia Mother    Hypertension Father    Heart disease Father    Cancer Sister 28       appendix   Melanoma Sister 58    Social  History   Tobacco Use   Smoking status: Never   Smokeless tobacco: Never  Vaping Use   Vaping status: Never Used  Substance Use Topics   Alcohol use: No   Drug use: No    No current facility-administered medications for this encounter.  Current Outpatient Medications:    [Paused] ALPRAZolam  (XANAX ) 0.5 MG tablet, Take 1 tablet (0.5 mg total) by mouth at bedtime as needed for anxiety., Disp: 30 tablet, Rfl: 3   amLODipine -benazepril  (LOTREL) 10-40 MG capsule, Take 1 capsule by mouth. One tab po every day., Disp: , Rfl:    Cholecalciferol (VITAMIN D ) 2000 UNITS tablet, Take 2,000 Units by mouth daily., Disp: , Rfl:    ibuprofen (ADVIL) 600 MG tablet, Take 1 tablet (600 mg total) by mouth every 8 (eight) hours as needed., Disp: 30 tablet, Rfl: 0   ipratropium (ATROVENT) 0.06 % nasal spray, Place 2 sprays into both nostrils 4 (four) times daily., Disp: 15 mL, Rfl: 0   levothyroxine  (SYNTHROID ) 125 MCG tablet, TAKE 1 TABLET BY MOUTH DAILY BEFORE BREAKFAST, Disp: 90 tablet, Rfl: 1   magnesium oxide (MAG-OX) 400 MG tablet, Take by mouth., Disp: , Rfl:    metoprolol succinate (TOPROL-XL) 25 MG 24 hr tablet, Take 1 tablet by  mouth daily. Patient now taking 37.5 mg, Disp: , Rfl:    Multiple Vitamins-Minerals (MULTIVITAMIN ADULT PO), Take by mouth., Disp: , Rfl:    nirmatrelvir/ritonavir (PAXLOVID) 20 x 150 MG & 10 x 100MG  TABS, Take 3 tablets by mouth 2 (two) times daily for 5 days. Patient GFR is >60. Take nirmatrelvir (150 mg) two tablets twice daily for 5 days and ritonavir (100 mg) one tablet twice daily for 5 days., Disp: 30 tablet, Rfl: 0   promethazine-dextromethorphan (PROMETHAZINE-DM) 6.25-15 MG/5ML syrup, Take 5 mLs by mouth 4 (four) times daily as needed for cough., Disp: 118 mL, Rfl: 0   buPROPion  (WELLBUTRIN  XL) 150 MG 24 hr tablet, Take 1 tablet (150 mg total) by mouth daily., Disp: , Rfl:    desonide  (DESOWEN ) 0.05 % cream, Apply topically 2 (two) times daily., Disp: 60 g, Rfl:  3  Allergies  Allergen Reactions   Effexor [Venlafaxine Hcl]     Scalp felt numb and crawling     ROS  As noted in HPI.   Physical Exam  BP (!) 147/74 (BP Location: Left Arm)   Pulse 68   Temp 99.4 F (37.4 C) (Oral)   Resp 16   Ht 5' 6.5" (1.689 m)   Wt 75.8 kg   SpO2 96%   BMI 26.57 kg/m   Constitutional: Well developed, well nourished, no acute distress Eyes: PERRL, EOMI, conjunctiva normal bilaterally HENT: Normocephalic, atraumatic,mucus membranes moist.  Positive nasal congestion.  No postnasal drip. Neck: Positive cervical lymphadenopathy Respiratory: Clear to auscultation bilaterally, no rales, no wheezing, no rhonchi Cardiovascular: Normal rate and rhythm, no murmurs, no gallops, no rubs GI: nondistended skin: No rash, skin intact Musculoskeletal: no deformities Neurologic: Alert & oriented x 3, CN III-XII grossly intact, no motor deficits, sensation grossly intact Psychiatric: Speech and behavior appropriate   ED Course   Medications - No data to display  Orders Placed This Encounter  Procedures   Resp panel by RT-PCR (RSV, Flu A&B, Covid) Anterior Nasal Swab    Standing Status:   Standing    Number of Occurrences:   1   Basic metabolic panel    Standing Status:   Standing    Number of Occurrences:   1   Airborne and Contact precautions    Standing Status:   Standing    Number of Occurrences:   1   Results for orders placed or performed during the hospital encounter of 11/17/23 (from the past 24 hours)  Resp panel by RT-PCR (RSV, Flu A&B, Covid) Anterior Nasal Swab     Status: Abnormal   Collection Time: 11/17/23  9:50 AM   Specimen: Anterior Nasal Swab  Result Value Ref Range   SARS Coronavirus 2 by RT PCR POSITIVE (A) NEGATIVE   Influenza A by PCR NEGATIVE NEGATIVE   Influenza B by PCR NEGATIVE NEGATIVE   Resp Syncytial Virus by PCR NEGATIVE NEGATIVE  Basic metabolic panel     Status: Abnormal   Collection Time: 11/17/23 11:16 AM  Result  Value Ref Range   Sodium 128 (L) 135 - 145 mmol/L   Potassium 4.2 3.5 - 5.1 mmol/L   Chloride 97 (L) 98 - 111 mmol/L   CO2 24 22 - 32 mmol/L   Glucose, Bld 108 (H) 70 - 99 mg/dL   BUN 9 8 - 23 mg/dL   Creatinine, Ser 0.45 0.44 - 1.00 mg/dL   Calcium 9.0 8.9 - 40.9 mg/dL   GFR, Estimated >81 >19 mL/min   Anion  gap 7 5 - 15   No results found.  ED Clinical Impression  1. COVID-19 virus infection   2. Hyponatremia      ED Assessment/Plan     1.  COVID-positive.  She is at high risk for progression to severe disease based on age, hypertension, hypothyroidism, unvaccinated status and depression.  Last kidney function done over a year ago.  Will check BMP.  Will prescribe appropriate dose of Paxlovid based on GFR.  Home with Tylenol/ibuprofen, saline nasal occasion, Mucinex, Promethazine DM, Atrovent nasal spray no Tussionex because it interacts with the Paxlovid.  Discussed this with patient.  She is amenable to plan.  GFR above 60.  Prescribing full-strength Paxlovid.  2.  Hyponatremia.  He has a headache, but I am attributing this to COVID.  She has no other signs or symptoms of hyponatremia.  She states that this has happened before that resolved with increased sodium intake.  Discussed lab findings with her, advised her to increase her salt intake, and follow-up with her PCP to have her sodium rechecked in 5 to 7 days.  She agrees with plan.  Discussed labs, MDM, treatment plan, and plan for follow-up with patient Discussed sn/sx that should prompt return to the ED. patient agrees with plan.   Meds ordered this encounter  Medications   ipratropium (ATROVENT) 0.06 % nasal spray    Sig: Place 2 sprays into both nostrils 4 (four) times daily.    Dispense:  15 mL    Refill:  0   ibuprofen (ADVIL) 600 MG tablet    Sig: Take 1 tablet (600 mg total) by mouth every 8 (eight) hours as needed.    Dispense:  30 tablet    Refill:  0   promethazine-dextromethorphan (PROMETHAZINE-DM)  6.25-15 MG/5ML syrup    Sig: Take 5 mLs by mouth 4 (four) times daily as needed for cough.    Dispense:  118 mL    Refill:  0   nirmatrelvir/ritonavir (PAXLOVID) 20 x 150 MG & 10 x 100MG  TABS    Sig: Take 3 tablets by mouth 2 (two) times daily for 5 days. Patient GFR is >60. Take nirmatrelvir (150 mg) two tablets twice daily for 5 days and ritonavir (100 mg) one tablet twice daily for 5 days.    Dispense:  30 tablet    Refill:  0      *This clinic note was created using Scientist, clinical (histocompatibility and immunogenetics). Therefore, there may be occasional mistakes despite careful proofreading. ?    Ethlyn Herd, MD 11/18/23 1151

## 2023-11-17 NOTE — Discharge Instructions (Signed)
 I will prescribe Paxlovid based on your lab work, which should be done in about an hour.  We will contact you if we need to change anything.  In the meantime, 600 mg of ibuprofen, 1000 mg of Tylenol 3-4 times a day as needed for pain, saline nasal irrigation with a NeilMed sinus rinse and distilled water as often as you want, Mucinex, Promethazine DM, Atrovent nasal spray for the nasal congestion and postnasal drip.  You must mask for 10 days after symptom onset.

## 2023-11-17 NOTE — ED Triage Notes (Signed)
 Pt c/o fever, headache, runny nose, watery eyes, sneezing, cough. Started yesterday. Pt recently traveled to [place].

## 2023-11-18 ENCOUNTER — Ambulatory Visit (HOSPITAL_COMMUNITY): Payer: Self-pay

## 2023-12-10 ENCOUNTER — Ambulatory Visit: Admitting: Internal Medicine

## 2023-12-12 NOTE — Progress Notes (Signed)
 [facility] [address] [place], [place] [address]  Pulmonary Sleep Medicine   Office Visit Note  Patient Name: Teresa Petty DOB: [birth year] MRN [id number]    Chief Complaint: Obstructive Sleep Apnea visit  Brief History:  Teresa Petty is seen today for a follow up visit for CPAP@ 10 cmH2O. The patient has a 5 month history of sleep apnea. Patient is using PAP nightly.  The patient feels rested after sleeping with PAP.  The patient reports benefiting from PAP use. Reported sleepiness is  improved and the Epworth Sleepiness Score is 1 out of 24. The patient will rarely take naps. The patient complains of the following: none.  The compliance download shows 79% compliance with an average use time of 7 hours 52 minutes. The AHI is 1.8.  The patient does not complain of limb movements disrupting sleep. The patient continues to require PAP therapy in order to eliminate sleep apnea.  ROS  General: (-) fever, (-) chills, (-) night sweat Nose and Sinuses: (-) nasal stuffiness or itchiness, (-) postnasal drip, (-) nosebleeds, (-) sinus trouble. Mouth and Throat: (-) sore throat, (-) hoarseness. Neck: (-) swollen glands, (-) enlarged thyroid , (-) neck pain. Respiratory: - cough, - shortness of breath, - wheezing. Neurologic: - numbness, - tingling. Psychiatric: - anxiety, - depression   Current Medication: Outpatient Encounter Medications as of 12/16/2023  Medication Sig   ALPRAZolam  (XANAX ) 0.5 MG tablet Take 1 tablet (0.5 mg total) by mouth at bedtime as needed for anxiety.   amLODipine -benazepril  (LOTREL) 10-40 MG capsule Take 1 capsule by mouth. One tab po every day.   Cholecalciferol (VITAMIN D ) 2000 UNITS tablet Take 2,000 Units by mouth daily.   desonide  (DESOWEN ) 0.05 % cream Apply topically 2 (two) times daily.   ipratropium (ATROVENT ) 0.06 % nasal spray Place 2 sprays into both nostrils 4 (four) times daily.   levothyroxine  (SYNTHROID ) 125 MCG tablet TAKE 1 TABLET BY  MOUTH DAILY BEFORE BREAKFAST   magnesium oxide (MAG-OX) 400 MG tablet Take by mouth.   metoprolol succinate (TOPROL-XL) 25 MG 24 hr tablet Take 1 tablet by mouth daily. Patient now taking 37.5 mg   Multiple Vitamins-Minerals (MULTIVITAMIN ADULT PO) Take by mouth.   promethazine -dextromethorphan (PROMETHAZINE -DM) 6.25-15 MG/5ML syrup Take 5 mLs by mouth 4 (four) times daily as needed for cough.   [DISCONTINUED] buPROPion  (WELLBUTRIN  XL) 150 MG 24 hr tablet Take 1 tablet (150 mg total) by mouth daily.   [DISCONTINUED] ibuprofen  (ADVIL ) 600 MG tablet Take 1 tablet (600 mg total) by mouth every 8 (eight) hours as needed.   No facility-administered encounter medications on file as of 12/16/2023.    Surgical History: Past Surgical History:  Procedure Laterality Date   APPENDECTOMY  2008   Dr Dessa   BREAST BIOPSY Left 11/19/2016   FIBROCYSTIC CHANGES WITH FEATURES OF RUPTURE AND SUBSEQUENT INFLAMMATION, CONSISTENT WITH   COLONOSCOPY  08/2012   Dr Viktoria   MOHS SURGERY     chin   TONSILLECTOMY      Medical History: Past Medical History:  Diagnosis Date   Anxiety    Basal cell carcinoma 05/07/2016   Left mid to low back 4cm lat to spine. Superficial.    Basal cell carcinoma 05/07/2016   Upper back spinal. Superficial with focal dermal invasion.   Basal cell carcinoma 02/21/2017   Left mid post. thigh. Superficial.    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)    Thyroid  disease    Thyromegaly     Family History: Non contributory to the present illness  Social History: Social History   Socioeconomic History   Marital status: Married    Spouse name: Not on file   Number of children: Not on file   Years of education: Not on file    Highest education level: Not on file  Occupational History   Not on file  Tobacco Use   Smoking status: Never   Smokeless tobacco: Never  Vaping Use   Vaping status: Never Used  Substance and Sexual Activity   Alcohol use: No   Drug use: No   Sexual activity: Not on file  Other Topics Concern   Not on file  Social History Narrative   Not on file   Social Drivers of Health   Financial Resource Strain: Not on file  Food Insecurity: Not on file  Transportation Needs: Not on file  Physical Activity: Not on file  Stress: Not on file  Social Connections: Not on file  Intimate Partner Violence: Not on file    Vital Signs: Blood pressure 126/74, pulse 78, resp. rate 16, height 5' 6 (1.676 m), weight 173 lb (78.5 kg), SpO2 98%. Body mass index is 27.92 kg/m.    Examination: General Appearance: The patient is well-developed, well-nourished, and in no distress. Neck Circumference: 42 cm Skin: Gross inspection of skin unremarkable. Head: normocephalic, no gross deformities. Eyes: no gross deformities noted. ENT: ears appear grossly normal Neurologic: Alert and oriented. No involuntary movements.  STOP BANG RISK ASSESSMENT S (snore) Have you been told that you snore?     NO   T (tired) Are you often tired, fatigued, or sleepy during the day?   NO  O (obstruction) Do you stop breathing, choke, or gasp during sleep? NO   P (pressure) Do you have or are you being treated for high blood pressure? YES   B (BMI) Is your body index greater than 35 kg/m? NO   A (age) Are you 68 years old or older? NO   N (neck) Do you have a neck circumference greater than 16 inches?   YES   G (gender) Are you a female? NO   TOTAL STOP/BANG "YES" ANSWERS 2       A STOP-Bang score of 2 or less is considered low risk, and a score of 5 or more is high risk for having either moderate or severe OSA. For people who score 3 or 4, doctors may need to perform further assessment to determine how  likely they are to have OSA.         EPWORTH SLEEPINESS SCALE:  Scale:  (0)= no chance of dozing; (1)= slight chance of dozing; (2)= moderate chance of dozing; (3)= high chance of dozing  Chance  Situtation    Sitting and reading: 0    Watching TV: 0    Sitting Inactive in public: 0    As a passenger in car: 0      Lying down to rest: 1    Sitting and talking: 0    Sitting quielty after lunch: 0    In a car, stopped in traffic: 0   TOTAL SCORE:  1 out of 24    SLEEP STUDIES:  PSG (07/2023) AHI 20.8/hr, Supine AHI 30.9, min SpO2 86% Titration (08/2023) CPAP@ 10 cmH2O   CPAP COMPLIANCE DATA:  Date Range: 09/16/2023-12/14/2023  Average Daily Use: 7 hours 52 minutes  Median Use: 8 hours 4 minutes  Compliance for > 4 Hours: 79%  AHI: 1.8 respiratory events per hour  Days Used: 71/90 days  Mask Leak: 8.1  95th Percentile Pressure: 13         LABS: Recent Results (from the past 2160 hours)  Resp panel by RT-PCR (RSV, Flu A&B, Covid) Anterior Nasal Swab     Status: Abnormal   Collection Time: 11/17/23  9:50 AM   Specimen: Anterior Nasal Swab  Result Value Ref Range   SARS Coronavirus 2 by RT PCR POSITIVE (A) NEGATIVE    Comment: (NOTE) SARS-CoV-2 target nucleic acids are DETECTED.  The SARS-CoV-2 RNA is generally detectable in upper respiratory specimens during the acute phase of infection. Positive results are indicative of the presence of the identified virus, but do not rule out bacterial infection or co-infection with other pathogens not detected by the test. Clinical correlation with patient history and other diagnostic information is necessary to determine patient infection status. The expected result is Negative.  Fact Sheet for Patients: BloggerCourse.com  Fact Sheet for Healthcare Providers: SeriousBroker.it  This test is not yet approved or cleared by the [place]  FDA and   has been authorized for detection and/or diagnosis of SARS-CoV-2 by FDA under an Emergency Use Authorization (EUA).  This EUA will remain in effect (meaning this test can be used) for the duration of  the COVID-19 declaration under Section 564(b)(1) of the A ct, 21 U.S.C. section 360bbb-3(b)(1), unless the authorization is terminated or revoked sooner.     Influenza A by PCR NEGATIVE NEGATIVE   Influenza B by PCR NEGATIVE NEGATIVE    Comment: (NOTE) The Xpert Xpress SARS-CoV-2/FLU/RSV plus assay is intended as an aid in the diagnosis of influenza from Nasopharyngeal swab specimens and should not be used as a sole basis for treatment. Nasal washings and aspirates are unacceptable for Xpert Xpress SARS-CoV-2/FLU/RSV testing.  Fact Sheet for Patients: BloggerCourse.com  Fact Sheet for Healthcare Providers: SeriousBroker.it  This test is not yet approved or cleared by the [place]  FDA and has been authorized for detection and/or diagnosis of SARS-CoV-2 by FDA under an Emergency Use Authorization (EUA). This EUA will remain in effect (meaning this test can be used) for the duration of the COVID-19 declaration under Section 564(b)(1) of the Act, 21 U.S.C. section 360bbb-3(b)(1), unless the authorization is terminated or revoked.     Resp Syncytial Virus by PCR NEGATIVE NEGATIVE    Comment: (NOTE) Fact Sheet for Patients: BloggerCourse.com  Fact Sheet for Healthcare Providers: SeriousBroker.it  This test is not yet approved or cleared by the [place]  FDA and has been authorized for detection and/or diagnosis of SARS-CoV-2 by FDA under an Emergency Use Authorization (EUA). This EUA will remain in effect (meaning this test can be used) for the duration of the COVID-19 declaration under Section 564(b)(1) of the Act, 21 U.S.C. section 360bbb-3(b)(1), unless the authorization  is terminated or revoked.  Performed at [facility], [address] [place], [place] [address]   Basic metabolic panel     Status: Abnormal   Collection Time: 11/17/23 11:16 AM  Result Value Ref Range   Sodium 128 (L) 135 - 145 mmol/L   Potassium 4.2 3.5 -  5.1 mmol/L   Chloride 97 (L) 98 - 111 mmol/L   CO2 24 22 - 32 mmol/L   Glucose, Bld 108 (H) 70 - 99 mg/dL    Comment: Glucose reference range applies only to samples taken after fasting for at least 8 hours.   BUN 9 8 - 23 mg/dL   Creatinine, Ser 9.14 0.44 - 1.00 mg/dL   Calcium 9.0 8.9 - 89.6 mg/dL   GFR, Estimated >39 >39 mL/min    Comment: (NOTE) Calculated using the CKD-EPI Creatinine Equation (2021)    Anion gap 7 5 - 15    Comment: Performed at [facility], [address], [place], [place] [address]    Radiology: No results found.  No results found.  No results found.    Assessment and Plan: Patient Active Problem List   Diagnosis Date Noted   OSA (obstructive sleep apnea) 12/16/2023   CPAP use counseling 12/16/2023   Benign hypertension 12/16/2023   Hypothyroidism 07/16/2017   Depressive disorder 07/16/2017   Cellulitis of female breast 11/16/2016   Premature ventricular contractions 06/01/2016   Chest pain with high risk for cardiac etiology 05/25/2016   Heart palpitations 05/25/2016   Left arm pain 10/30/2012   Essential hypertension 10/30/2012   1. OSA (obstructive sleep apnea) (Primary) The patient does tolerate PAP and reports tremendous benefit from PAP use. She has been able to cut her blood pressure medication in half since starting cpap. The patient was reminded how to clean equipment and advised to replace supplies routinely. The patient was also counselled on weight loss. The compliance is good. The AHI is 1.8., she complains of mouth venting and we will try a chin strap.   OSA on cpap- controlled. Continue with compliance with pap. CPAP continues to be medically  necessary to treat this patient's OSA. F/u 48m  2. CPAP use counseling CPAP Counseling: had a lengthy discussion with the patient regarding the importance of PAP therapy in management of the sleep apnea. Patient appears to understand the risk factor reduction and also understands the risks associated with untreated sleep apnea. Patient will try to make a good faith effort to remain compliant with therapy. Also instructed the patient on proper cleaning of the device including the water must be changed daily if possible and use of distilled water is preferred. Patient understands that the machine should be regularly cleaned with appropriate recommended cleaning solutions that do not damage the PAP machine for example given white vinegar and water rinses. Other methods such as ozone treatment may not be as good as these simple methods to achieve cleaning.   3. Benign hypertension Medication has been reduced- doing well, controlled. Continue metoprolol and amlodipine - benazepril .     General Counseling: I have discussed the findings of the evaluation and examination with Romero.  I have also discussed any further diagnostic evaluation thatmay be needed or ordered today. Tanasha verbalizes understanding of the findings of todays visit. We also reviewed her medications today and discussed drug interactions and side effects including but not limited excessive drowsiness and altered mental states. We also discussed that there is always a risk not just to her but also people around her. she has been encouraged to call the office with any questions or concerns that should arise related to todays visit.  No orders of the defined types were placed in this encounter.       I have personally obtained a history, examined the patient, evaluated laboratory and imaging results,  formulated the assessment and plan and placed orders. This patient was seen today by Lauraine Lay, PA-C in collaboration with Dr. Elfreda Bathe.   Elfreda DELENA Bathe, MD [facility] Diplomate ABMS Pulmonary Critical Care Medicine and Sleep Medicine

## 2023-12-16 ENCOUNTER — Ambulatory Visit (INDEPENDENT_AMBULATORY_CARE_PROVIDER_SITE_OTHER): Admitting: Internal Medicine

## 2023-12-16 VITALS — BP 126/74 | HR 78 | Resp 16 | Ht 66.0 in | Wt 173.0 lb

## 2023-12-16 DIAGNOSIS — Z7189 Other specified counseling: Secondary | ICD-10-CM | POA: Diagnosis not present

## 2023-12-16 DIAGNOSIS — I10 Essential (primary) hypertension: Secondary | ICD-10-CM | POA: Diagnosis not present

## 2023-12-16 DIAGNOSIS — G4733 Obstructive sleep apnea (adult) (pediatric): Secondary | ICD-10-CM | POA: Diagnosis not present

## 2023-12-16 NOTE — Patient Instructions (Signed)

## 2023-12-17 ENCOUNTER — Ambulatory Visit (INDEPENDENT_AMBULATORY_CARE_PROVIDER_SITE_OTHER): Admitting: Internal Medicine

## 2023-12-17 ENCOUNTER — Encounter: Payer: Self-pay | Admitting: Internal Medicine

## 2023-12-17 VITALS — BP 130/80 | HR 64 | Temp 97.9°F | Resp 16 | Ht 66.5 in | Wt 172.2 lb

## 2023-12-17 DIAGNOSIS — E782 Mixed hyperlipidemia: Secondary | ICD-10-CM | POA: Diagnosis not present

## 2023-12-17 DIAGNOSIS — E039 Hypothyroidism, unspecified: Secondary | ICD-10-CM | POA: Diagnosis not present

## 2023-12-17 DIAGNOSIS — G4733 Obstructive sleep apnea (adult) (pediatric): Secondary | ICD-10-CM | POA: Diagnosis not present

## 2023-12-17 DIAGNOSIS — E871 Hypo-osmolality and hyponatremia: Secondary | ICD-10-CM

## 2023-12-17 DIAGNOSIS — I10 Essential (primary) hypertension: Secondary | ICD-10-CM | POA: Diagnosis not present

## 2023-12-17 DIAGNOSIS — Z0001 Encounter for general adult medical examination with abnormal findings: Secondary | ICD-10-CM

## 2023-12-17 NOTE — Progress Notes (Signed)
 [facility] [address] [place], [place] [address]  Internal MEDICINE  Office Visit Note  Patient Name: Teresa Petty  [id number]  [id number]  Date of Service: 12/17/2023  Chief Complaint  Patient presents with   Medicare Wellness   Depression   Hypertension     HPI Pt is here for routine health maintenance examination She is sleeping better on CPAP, she is off all hypnotics Up to date on colonoscopy, needs labs updated  Swims on a regular basis  Mammogram are done by obgyn H/o low sodium, drinks excessive amount of water    Current Medication: Outpatient Encounter Medications as of 12/17/2023  Medication Sig   amLODipine -benazepril  (LOTREL) 10-40 MG capsule Take 1 capsule by mouth. One tab po every day.   Cholecalciferol (VITAMIN D ) 2000 UNITS tablet Take 2,000 Units by mouth daily.   desonide  (DESOWEN ) 0.05 % cream Apply topically 2 (two) times daily.   ipratropium (ATROVENT ) 0.06 % nasal spray Place 2 sprays into both nostrils 4 (four) times daily.   levothyroxine  (SYNTHROID ) 125 MCG tablet TAKE 1 TABLET BY MOUTH DAILY BEFORE BREAKFAST   magnesium oxide (MAG-OX) 400 MG tablet Take by mouth.   metoprolol succinate (TOPROL-XL) 25 MG 24 hr tablet Take 1 tablet by mouth daily. Patient now taking 37.5 mg   Multiple Vitamins-Minerals (MULTIVITAMIN ADULT PO) Take by mouth.   [DISCONTINUED] ALPRAZolam  (XANAX ) 0.5 MG tablet Take 1 tablet (0.5 mg total) by mouth at bedtime as needed for anxiety.   [DISCONTINUED] promethazine -dextromethorphan (PROMETHAZINE -DM) 6.25-15 MG/5ML syrup Take 5 mLs by mouth 4 (four) times daily as needed for cough.   No facility-administered encounter medications on file as of 12/17/2023.    Surgical History: Past Surgical History:  Procedure Laterality Date   APPENDECTOMY  2008   Dr Dessa   BREAST BIOPSY Left 11/19/2016   FIBROCYSTIC CHANGES WITH FEATURES OF RUPTURE AND SUBSEQUENT INFLAMMATION, CONSISTENT WITH   COLONOSCOPY  08/2012    Dr Viktoria   MOHS SURGERY     chin   TONSILLECTOMY      Medical History: Past Medical History:  Diagnosis Date   Anxiety    Basal cell carcinoma 05/07/2016   Left mid to low back 4cm lat to spine. Superficial.    Basal cell carcinoma 05/07/2016   Upper back spinal. Superficial with focal dermal invasion.   Basal cell carcinoma 02/21/2017   Left mid post. thigh. Superficial.    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)    Thyroid  disease    Thyromegaly     Family History: Family History  Problem Relation Age of Onset   Hypertension Mother    Hyperlipidemia Mother    Hypertension Father    Heart disease Father    Cancer Sister 59       appendix   Melanoma Sister 7    Social History: Social History   Socioeconomic History   Marital status: Married    Spouse name: Not on file   Number of children: Not on file   Years of education: Not on file   Highest education level: Not on file  Occupational History   Not  on file  Tobacco Use   Smoking status: Never   Smokeless tobacco: Never  Vaping Use   Vaping status: Never Used  Substance and Sexual Activity   Alcohol use: No   Drug use: No   Sexual activity: Not on file  Other Topics Concern   Not on file  Social History Narrative   Not on file   Social Drivers of Health   Financial Resource Strain: Not on file  Food Insecurity: Not on file  Transportation Needs: Not on file  Physical Activity: Not on file  Stress: Not on file  Social Connections: Not on file      Review of Systems  Constitutional:  Negative for chills, fatigue and unexpected weight change.  HENT:  Negative for congestion, postnasal drip, rhinorrhea, sneezing and sore throat.   Eyes:   Negative for redness.  Respiratory:  Negative for cough, chest tightness and shortness of breath.   Cardiovascular:  Negative for chest pain and palpitations.  Gastrointestinal:  Negative for abdominal pain, constipation, diarrhea, nausea and vomiting.  Genitourinary:  Negative for dysuria and frequency.  Musculoskeletal:  Negative for arthralgias, back pain, joint swelling and neck pain.       Knee pain   Skin:  Negative for rash.  Neurological: Negative.  Negative for tremors and numbness.  Hematological:  Negative for adenopathy. Does not bruise/bleed easily.  Psychiatric/Behavioral:  Negative for behavioral problems (Depression), sleep disturbance and suicidal ideas. The patient is not nervous/anxious.      Vital Signs: BP (!) 150/88   Pulse 64   Temp 97.9 F (36.6 C)   Resp 16   Ht 5' 6.5 (1.689 m)   Wt 172 lb 3.2 oz (78.1 kg)   SpO2 99%   BMI 27.38 kg/m    Physical Exam Constitutional:      Appearance: Normal appearance.  HENT:     Head: Normocephalic and atraumatic.     Nose: Nose normal.     Mouth/Throat:     Mouth: Mucous membranes are moist.     Pharynx: No posterior oropharyngeal erythema.  Eyes:     Extraocular Movements: Extraocular movements intact.     Pupils: Pupils are equal, round, and reactive to light.  Cardiovascular:     Pulses: Normal pulses.     Heart sounds: Normal heart sounds.  Pulmonary:     Effort: Pulmonary effort is normal.     Breath sounds: Normal breath sounds.  Neurological:     General: No focal deficit present.     Mental Status: She is alert.  Psychiatric:        Mood and Affect: Mood normal.        Behavior: Behavior normal.      LABS: Recent Results (from the past 2160 hours)  Resp panel by RT-PCR (RSV, Flu A&B, Covid) Anterior Nasal Swab     Status: Abnormal   Collection Time: 11/17/23  9:50 AM   Specimen: Anterior Nasal Swab  Result Value Ref Range   SARS Coronavirus 2 by RT PCR POSITIVE (A) NEGATIVE    Comment:  (NOTE) SARS-CoV-2 target nucleic acids are DETECTED.  The SARS-CoV-2 RNA is generally detectable in upper respiratory specimens during the acute phase of infection. Positive results are indicative of the presence of the identified virus, but do not rule out bacterial infection or co-infection with other pathogens not detected by the test. Clinical correlation with patient history and other diagnostic information is necessary to determine patient infection status. The expected  result is Negative.  Fact Sheet for Patients: BloggerCourse.com  Fact Sheet for Healthcare Providers: SeriousBroker.it  This test is not yet approved or cleared by the [place]  FDA and  has been authorized for detection and/or diagnosis of SARS-CoV-2 by FDA under an Emergency Use Authorization (EUA).  This EUA will remain in effect (meaning this test can be used) for the duration of  the COVID-19 declaration under Section 564(b)(1) of the A ct, 21 U.S.C. section 360bbb-3(b)(1), unless the authorization is terminated or revoked sooner.     Influenza A by PCR NEGATIVE NEGATIVE   Influenza B by PCR NEGATIVE NEGATIVE    Comment: (NOTE) The Xpert Xpress SARS-CoV-2/FLU/RSV plus assay is intended as an aid in the diagnosis of influenza from Nasopharyngeal swab specimens and should not be used as a sole basis for treatment. Nasal washings and aspirates are unacceptable for Xpert Xpress SARS-CoV-2/FLU/RSV testing.  Fact Sheet for Patients: BloggerCourse.com  Fact Sheet for Healthcare Providers: SeriousBroker.it  This test is not yet approved or cleared by the [place]  FDA and has been authorized for detection and/or diagnosis of SARS-CoV-2 by FDA under an Emergency Use Authorization (EUA). This EUA will remain in effect (meaning this test can be used) for the duration of the COVID-19 declaration under  Section 564(b)(1) of the Act, 21 U.S.C. section 360bbb-3(b)(1), unless the authorization is terminated or revoked.     Resp Syncytial Virus by PCR NEGATIVE NEGATIVE    Comment: (NOTE) Fact Sheet for Patients: BloggerCourse.com  Fact Sheet for Healthcare Providers: SeriousBroker.it  This test is not yet approved or cleared by the [place]  FDA and has been authorized for detection and/or diagnosis of SARS-CoV-2 by FDA under an Emergency Use Authorization (EUA). This EUA will remain in effect (meaning this test can be used) for the duration of the COVID-19 declaration under Section 564(b)(1) of the Act, 21 U.S.C. section 360bbb-3(b)(1), unless the authorization is terminated or revoked.  Performed at [facility], [address], [place], [place] [address]   Basic metabolic panel     Status: Abnormal   Collection Time: 11/17/23 11:16 AM  Result Value Ref Range   Sodium 128 (L) 135 - 145 mmol/L   Potassium 4.2 3.5 - 5.1 mmol/L   Chloride 97 (L) 98 - 111 mmol/L   CO2 24 22 - 32 mmol/L   Glucose, Bld 108 (H) 70 - 99 mg/dL    Comment: Glucose reference range applies only to samples taken after fasting for at least 8 hours.   BUN 9 8 - 23 mg/dL   Creatinine, Ser 9.14 0.44 - 1.00 mg/dL   Calcium 9.0 8.9 - 89.6 mg/dL   GFR, Estimated >39 >39 mL/min    Comment: (NOTE) Calculated using the CKD-EPI Creatinine Equation (2021)    Anion gap 7 5 - 15    Comment: Performed at [facility], [address], [place], [place] [address]       Assessment/Plan: 1. Encounter for general adult medical examination with abnormal findings (Primary) Will update all labs  - CBC with Differential/Platelet; Future - Lipid Panel With LDL/HDL Ratio; Future - TSH; Future - T4, free; Future - Comprehensive metabolic panel with GFR  2. Benign hypertension Home readings have been normal  - Comprehensive metabolic panel  with GFR  3. OSA (obstructive sleep apnea) Continue CPAP as before   4. Hypothyroidism, unspecified type Continue synthroid   - TSH; Future - T4, free; Future  5. Mixed hyperlipidemia - Lipid Panel  With LDL/HDL Ratio; Future - TSH; Future - T4, free; Future  6. Hyponatremia Pt is instructed to drink one cup of Gatorlite for sodium intake  - Comprehensive metabolic panel with GFR     General Counseling: Taleah verbalizes understanding of the findings of todays visit and agrees with plan of treatment. I have discussed any further diagnostic evaluation that may be needed or ordered today. We also reviewed her medications today. she has been encouraged to call the office with any questions or concerns that should arise related to todays visit.    Counseling:  [place] Controlled Substance Database was reviewed by me.  Orders Placed This Encounter  Procedures   CBC with Differential/Platelet   Lipid Panel With LDL/HDL Ratio   TSH   T4, free   Comprehensive metabolic panel with GFR    No orders of the defined types were placed in this encounter.   Total time spent:35 Minutes  Time spent includes review of chart, medications, test results, and follow up plan with the patient.     Sigrid CHRISTELLA Bathe, MD  Internal Medicine

## 2023-12-21 ENCOUNTER — Encounter: Payer: Self-pay | Admitting: Internal Medicine

## 2023-12-21 DIAGNOSIS — M810 Age-related osteoporosis without current pathological fracture: Secondary | ICD-10-CM

## 2024-01-01 LAB — COMPREHENSIVE METABOLIC PANEL WITH GFR
ALT: 24 IU/L (ref 0–32)
AST: 25 IU/L (ref 0–40)
Albumin: 4.1 g/dL (ref 3.9–4.9)
Alkaline Phosphatase: 77 IU/L (ref 44–121)
BUN/Creatinine Ratio: 17 (ref 12–28)
BUN: 15 mg/dL (ref 8–27)
Bilirubin Total: 0.5 mg/dL (ref 0.0–1.2)
CO2: 24 mmol/L (ref 20–29)
Calcium: 9.5 mg/dL (ref 8.7–10.3)
Chloride: 102 mmol/L (ref 96–106)
Creatinine, Ser: 0.88 mg/dL (ref 0.57–1.00)
Globulin, Total: 2.6 g/dL (ref 1.5–4.5)
Glucose: 97 mg/dL (ref 70–99)
Potassium: 4.7 mmol/L (ref 3.5–5.2)
Sodium: 139 mmol/L (ref 134–144)
Total Protein: 6.7 g/dL (ref 6.0–8.5)
eGFR: 72 mL/min/1.73 (ref >59)

## 2024-01-02 ENCOUNTER — Other Ambulatory Visit: Payer: Self-pay

## 2024-01-02 ENCOUNTER — Telehealth: Payer: Self-pay

## 2024-01-02 DIAGNOSIS — E039 Hypothyroidism, unspecified: Secondary | ICD-10-CM

## 2024-01-02 DIAGNOSIS — E782 Mixed hyperlipidemia: Secondary | ICD-10-CM

## 2024-01-02 DIAGNOSIS — Z0001 Encounter for general adult medical examination with abnormal findings: Secondary | ICD-10-CM

## 2024-01-02 NOTE — Telephone Encounter (Signed)
 Pt called  that [facility] only did CMP pt will go back Monday and to do rest of labs

## 2024-01-04 LAB — LIPID PANEL WITH LDL/HDL RATIO
Cholesterol, Total: 169 mg/dL (ref 100–199)
HDL: 79 mg/dL (ref >39)
LDL Chol Calc (NIH): 80 mg/dL (ref 0–99)
LDL/HDL Ratio: 1 ratio (ref 0.0–3.2)
Triglycerides: 47 mg/dL (ref 0–149)
VLDL Cholesterol Cal: 10 mg/dL (ref 5–40)

## 2024-01-04 LAB — CBC WITH DIFFERENTIAL/PLATELET
Basophils Absolute: 0.1 x10E3/uL (ref 0.0–0.2)
Basos: 1 %
EOS (ABSOLUTE): 0.2 x10E3/uL (ref 0.0–0.4)
Eos: 4 %
Hematocrit: 36.8 % (ref 34.0–46.6)
Hemoglobin: 12.3 g/dL (ref 11.1–15.9)
Immature Grans (Abs): 0 x10E3/uL (ref 0.0–0.1)
Immature Granulocytes: 0 %
Lymphocytes Absolute: 1.7 x10E3/uL (ref 0.7–3.1)
Lymphs: 37 %
MCH: 30.8 pg (ref 26.6–33.0)
MCHC: 33.4 g/dL (ref 31.5–35.7)
MCV: 92 fL (ref 79–97)
Monocytes Absolute: 0.4 x10E3/uL (ref 0.1–0.9)
Monocytes: 9 %
Neutrophils Absolute: 2.2 x10E3/uL (ref 1.4–7.0)
Neutrophils: 49 %
Platelets: 331 x10E3/uL (ref 150–450)
RBC: 3.99 x10E6/uL (ref 3.77–5.28)
RDW: 12.5 % (ref 11.7–15.4)
WBC: 4.5 x10E3/uL (ref 3.4–10.8)

## 2024-01-04 LAB — TSH: TSH: 2.96 u[IU]/mL (ref 0.450–4.500)

## 2024-01-04 LAB — T4, FREE: Free T4: 1.37 ng/dL (ref 0.82–1.77)

## 2024-01-08 NOTE — Telephone Encounter (Signed)
 Pt notified we placed order for bone density please call [place]

## 2024-04-02 ENCOUNTER — Other Ambulatory Visit: Payer: Self-pay | Admitting: Internal Medicine

## 2024-04-02 ENCOUNTER — Other Ambulatory Visit: Payer: Self-pay | Admitting: Nurse Practitioner

## 2024-04-02 DIAGNOSIS — L209 Atopic dermatitis, unspecified: Secondary | ICD-10-CM

## 2024-06-26 ENCOUNTER — Other Ambulatory Visit: Payer: Self-pay | Admitting: Nurse Practitioner

## 2024-07-20 ENCOUNTER — Ambulatory Visit

## 2024-12-17 ENCOUNTER — Ambulatory Visit: Admitting: Physician Assistant

## 2024-12-22 ENCOUNTER — Ambulatory Visit: Admitting: Internal Medicine
